# Patient Record
Sex: Female | Born: 1970
Health system: Southern US, Community
[De-identification: ages and names within clinical notes are randomized; demographics above are authoritative.]

## PROBLEM LIST (undated history)

## (undated) DIAGNOSIS — R202 Paresthesia of skin: Secondary | ICD-10-CM

## (undated) DIAGNOSIS — I10 Essential (primary) hypertension: Secondary | ICD-10-CM

## (undated) DIAGNOSIS — J45909 Unspecified asthma, uncomplicated: Secondary | ICD-10-CM

## (undated) DIAGNOSIS — M79603 Pain in arm, unspecified: Secondary | ICD-10-CM

## (undated) DIAGNOSIS — O139 Gestational [pregnancy-induced] hypertension without significant proteinuria, unspecified trimester: Secondary | ICD-10-CM

## (undated) HISTORY — DX: Pain in arm, unspecified: M79.603

## (undated) HISTORY — PX: TUBAL LIGATION: SHX77

## (undated) HISTORY — DX: Gestational (pregnancy-induced) hypertension without significant proteinuria, unspecified trimester: O13.9

## (undated) HISTORY — DX: Paresthesia of skin: R20.2

## (undated) HISTORY — DX: Essential (primary) hypertension: I10

## (undated) HISTORY — DX: Unspecified asthma, uncomplicated: J45.909

---

## 2009-01-03 ENCOUNTER — Encounter: Payer: Self-pay | Admitting: Cardiology

## 2009-01-05 ENCOUNTER — Encounter: Payer: Self-pay | Admitting: Cardiology

## 2009-01-13 ENCOUNTER — Encounter: Payer: Self-pay | Admitting: Cardiology

## 2009-01-15 ENCOUNTER — Encounter: Payer: Self-pay | Admitting: Cardiology

## 2009-01-15 ENCOUNTER — Ambulatory Visit: Payer: Self-pay | Admitting: Cardiology

## 2009-01-15 DIAGNOSIS — R0602 Shortness of breath: Secondary | ICD-10-CM | POA: Insufficient documentation

## 2009-01-15 DIAGNOSIS — R079 Chest pain, unspecified: Secondary | ICD-10-CM | POA: Insufficient documentation

## 2009-01-27 ENCOUNTER — Ambulatory Visit: Payer: Self-pay

## 2009-01-27 ENCOUNTER — Encounter: Payer: Self-pay | Admitting: Cardiology

## 2011-01-03 ENCOUNTER — Encounter: Payer: Self-pay | Admitting: Cardiology

## 2018-03-26 DIAGNOSIS — J4551 Severe persistent asthma with (acute) exacerbation: Secondary | ICD-10-CM | POA: Diagnosis not present

## 2018-08-05 DIAGNOSIS — M7552 Bursitis of left shoulder: Secondary | ICD-10-CM | POA: Diagnosis not present

## 2018-09-03 DIAGNOSIS — M7552 Bursitis of left shoulder: Secondary | ICD-10-CM | POA: Diagnosis not present

## 2018-09-03 DIAGNOSIS — R5383 Other fatigue: Secondary | ICD-10-CM | POA: Diagnosis not present

## 2018-09-13 DIAGNOSIS — M7552 Bursitis of left shoulder: Secondary | ICD-10-CM | POA: Diagnosis not present

## 2018-09-13 DIAGNOSIS — S46211A Strain of muscle, fascia and tendon of other parts of biceps, right arm, initial encounter: Secondary | ICD-10-CM | POA: Diagnosis not present

## 2018-09-19 DIAGNOSIS — M25612 Stiffness of left shoulder, not elsewhere classified: Secondary | ICD-10-CM | POA: Diagnosis not present

## 2018-09-19 DIAGNOSIS — M25512 Pain in left shoulder: Secondary | ICD-10-CM | POA: Diagnosis not present

## 2018-12-10 DIAGNOSIS — H9201 Otalgia, right ear: Secondary | ICD-10-CM | POA: Diagnosis not present

## 2019-01-14 DIAGNOSIS — M5431 Sciatica, right side: Secondary | ICD-10-CM | POA: Diagnosis not present

## 2019-01-21 DIAGNOSIS — M79661 Pain in right lower leg: Secondary | ICD-10-CM | POA: Diagnosis not present

## 2019-01-21 DIAGNOSIS — I8011 Phlebitis and thrombophlebitis of right femoral vein: Secondary | ICD-10-CM | POA: Diagnosis not present

## 2019-01-21 DIAGNOSIS — M5431 Sciatica, right side: Secondary | ICD-10-CM | POA: Diagnosis not present

## 2019-03-27 DIAGNOSIS — Z1159 Encounter for screening for other viral diseases: Secondary | ICD-10-CM | POA: Diagnosis not present

## 2019-03-27 DIAGNOSIS — J4551 Severe persistent asthma with (acute) exacerbation: Secondary | ICD-10-CM | POA: Diagnosis not present

## 2019-03-27 DIAGNOSIS — Z6823 Body mass index (BMI) 23.0-23.9, adult: Secondary | ICD-10-CM | POA: Diagnosis not present

## 2019-04-04 DIAGNOSIS — J4551 Severe persistent asthma with (acute) exacerbation: Secondary | ICD-10-CM | POA: Diagnosis not present

## 2019-04-04 DIAGNOSIS — Z1159 Encounter for screening for other viral diseases: Secondary | ICD-10-CM | POA: Diagnosis not present

## 2019-04-07 DIAGNOSIS — J45909 Unspecified asthma, uncomplicated: Secondary | ICD-10-CM | POA: Diagnosis not present

## 2019-04-07 DIAGNOSIS — J4520 Mild intermittent asthma, uncomplicated: Secondary | ICD-10-CM | POA: Diagnosis not present

## 2019-04-07 DIAGNOSIS — R0602 Shortness of breath: Secondary | ICD-10-CM | POA: Diagnosis not present

## 2019-04-07 DIAGNOSIS — J4551 Severe persistent asthma with (acute) exacerbation: Secondary | ICD-10-CM | POA: Diagnosis not present

## 2019-04-07 DIAGNOSIS — J45901 Unspecified asthma with (acute) exacerbation: Secondary | ICD-10-CM | POA: Diagnosis not present

## 2019-06-09 DIAGNOSIS — I10 Essential (primary) hypertension: Secondary | ICD-10-CM | POA: Diagnosis not present

## 2019-06-09 DIAGNOSIS — Z1159 Encounter for screening for other viral diseases: Secondary | ICD-10-CM | POA: Diagnosis not present

## 2019-06-18 DIAGNOSIS — I10 Essential (primary) hypertension: Secondary | ICD-10-CM | POA: Diagnosis not present

## 2019-07-16 DIAGNOSIS — Z1159 Encounter for screening for other viral diseases: Secondary | ICD-10-CM | POA: Diagnosis not present

## 2019-07-16 DIAGNOSIS — Z20828 Contact with and (suspected) exposure to other viral communicable diseases: Secondary | ICD-10-CM | POA: Diagnosis not present

## 2019-07-16 DIAGNOSIS — J45902 Unspecified asthma with status asthmaticus: Secondary | ICD-10-CM | POA: Diagnosis not present

## 2019-07-23 DIAGNOSIS — Z03818 Encounter for observation for suspected exposure to other biological agents ruled out: Secondary | ICD-10-CM | POA: Diagnosis not present

## 2019-09-30 ENCOUNTER — Other Ambulatory Visit: Payer: Self-pay

## 2019-09-30 ENCOUNTER — Encounter: Payer: Self-pay | Admitting: Legal Medicine

## 2019-09-30 ENCOUNTER — Ambulatory Visit: Payer: BC Managed Care – PPO | Admitting: Legal Medicine

## 2019-09-30 DIAGNOSIS — G540 Brachial plexus disorders: Secondary | ICD-10-CM | POA: Diagnosis not present

## 2019-09-30 DIAGNOSIS — R030 Elevated blood-pressure reading, without diagnosis of hypertension: Secondary | ICD-10-CM | POA: Diagnosis not present

## 2019-09-30 MED ORDER — PREDNISONE 50 MG PO TABS: ORAL_TABLET | ORAL | 0 refills | Status: DC

## 2019-09-30 MED ORDER — HYDROCODONE-ACETAMINOPHEN 5-325 MG PO TABS: 1.0000 | ORAL_TABLET | Freq: Four times a day (QID) | ORAL | Status: DC | PRN

## 2019-09-30 NOTE — Assessment & Plan Note (Addendum)
Patient needs steroids and medicine for neuritiis.  Try prednisone 50mg  qd for 5 days.and hydrocodone for pain.  May need neurontin. We discussed possible NCS/EMG.  No trauma of underlying diseases.  May need Chest x-ray.

## 2019-09-30 NOTE — Progress Notes (Signed)
Acute Office Visit  Subjective:    Patient ID: Alison Simpson, female    DOB: March 01, 1971, 49 y.o.   MRN: [id number]  Chief Complaint  Patient presents with  . Arm Pain    bilateral arm pain with numbness and tingling left arm    HPI Patient is in today for right arm pain.  Started last night until midnight. She has pain in brachial plexus bilaterally, no pain in neck.  Negative Lhermitte.  Normal pulses and sensation.  Past Medical History:  Diagnosis Date  . Pregnancy induced hypertension     Past Surgical History:  Procedure Laterality Date  . TUBAL LIGATION     1999    Family History  Problem Relation Age of Onset  . Diabetes Neg Hx   . Hypertension Neg Hx   . Coronary artery disease Neg Hx     Social History   Socioeconomic History  . Marital status: Married    Spouse name: Not on file  . Number of children: Not on file  . Years of education: Not on file  . Highest education level: Not on file  Occupational History  . Not on file  Tobacco Use  . Smoking status: Never Smoker  . Smokeless tobacco: Never Used  Substance and Sexual Activity  . Alcohol use: No  . Drug use: No  . Sexual activity: Yes  Other Topics Concern  . Not on file  Social History Narrative   Married, 1 child. Employed full time. Walks 20 min twice/week.    Social Determinants of Health   Financial Resource Strain:   . Difficulty of Paying Living Expenses:   Food Insecurity:   . Worried About Programme researcher, broadcasting/film/video in the Last Year:   . Barista in the Last Year:   Transportation Needs:   . Freight forwarder (Medical):   Marland Kitchen Lack of Transportation (Non-Medical):   Physical Activity:   . Days of Exercise per Week:   . Minutes of Exercise per Session:   Stress:   . Feeling of Stress :   Social Connections:   . Frequency of Communication with Friends and Family:   . Frequency of Social Gatherings with Friends and Family:   . Attends Religious Services:   . Active  Member of Clubs or Organizations:   . Attends Banker Meetings:   Marland Kitchen Marital Status:   Intimate Partner Violence:   . Fear of Current or Ex-Partner:   . Emotionally Abused:   Marland Kitchen Physically Abused:   . Sexually Abused:     Outpatient Medications Prior to Visit  Medication Sig Dispense Refill  . montelukast (SINGULAIR) 10 MG tablet Take 10 mg by mouth at bedtime.    Marland Kitchen olmesartan (BENICAR) 40 MG tablet Take 40 mg by mouth daily.    Marland Kitchen albuterol (PROVENTIL) (2.5 MG/3ML) 0.083% nebulizer solution INHALE 3 MILLILITERS (2.5 MG) BY NEBULIZATION ROUTE 3 TIMES PER DAY     No facility-administered medications prior to visit.    No Known Allergies  Review of Systems  Constitutional: Negative.   HENT: Negative.   Eyes: Negative.   Respiratory: Negative.   Cardiovascular: Negative.   Gastrointestinal: Negative.   Endocrine: Negative.   Genitourinary: Negative.   Musculoskeletal: Negative.   Neurological: Positive for numbness.  Psychiatric/Behavioral: Negative.        Objective:    Physical Exam Vitals reviewed.  Constitutional:      Appearance: Normal appearance.  HENT:  Head: Normocephalic and atraumatic.  Cardiovascular:     Rate and Rhythm: Normal rate and regular rhythm.     Pulses: Normal pulses.     Heart sounds: Normal heart sounds.  Pulmonary:     Effort: Pulmonary effort is normal.     Breath sounds: Normal breath sounds.  Musculoskeletal:        General: Normal range of motion.  Skin:    General: Skin is warm and dry.  Neurological:     General: No focal deficit present.     Mental Status: She is alert and oriented to person, place, and time.     Cranial Nerves: Cranial nerves are intact.     Motor: Motor function is intact.     Coordination: Coordination is intact.     Gait: Gait is intact.     Comments: Pain on pressure to brachial plexus bilaterally, no pain ROM neck or shoulders.  Negative Lhermitte.  No focal neurologic signs.  Both hands  warm with good pulses.     BP 110/70   Pulse 88   Temp 97.7 F (36.5 C)   Ht 5\' 2"  (1.575 m)   Wt 149 lb 3.2 oz (67.7 kg)   BMI 27.29 kg/m  Wt Readings from Last 3 Encounters:  09/30/19 149 lb 3.2 oz (67.7 kg)  01/15/09 149 lb (67.6 kg)    Health Maintenance Due  Topic Date Due  . HIV Screening  Never done  . TETANUS/TDAP  Never done  . PAP SMEAR-Modifier  Never done  . INFLUENZA VACCINE  Never done    There are no preventive care reminders to display for this patient.   No results found for: TSH Lab Results  Component Value Date   WBC 8.2 09/30/2019   HGB 13.8 09/30/2019   HCT 39.7 09/30/2019   MCV 83 09/30/2019   PLT 366 09/30/2019   Lab Results  Component Value Date   NA 143 09/30/2019   K 4.0 09/30/2019   CO2 23 09/30/2019   GLUCOSE 95 09/30/2019   BUN 10 09/30/2019   CREATININE 0.77 09/30/2019   BILITOT 0.2 09/30/2019   ALKPHOS 67 09/30/2019   AST 19 09/30/2019   ALT 24 09/30/2019   PROT 7.2 09/30/2019   ALBUMIN 4.2 09/30/2019   CALCIUM 9.4 09/30/2019   No results found for: CHOL No results found for: HDL No results found for: LDLCALC No results found for: TRIG No results found for: CHOLHDL No results found for: HGBA1C     Assessment & Plan:   Problem List Items Addressed This Visit      Nervous and Auditory   Brachial plexitis    Patient needs steroids and medicine for neuritiis.  Try prednisone 50mg  qd for 5 days.and hydrocodone for pain.  May need neurontin. We discussed possible NCS/EMG.  No trauma of underlying diseases.  May need Chest x-ray.      Relevant Medications   predniSONE (DELTASONE) 50 MG tablet   HYDROcodone-acetaminophen (NORCO/VICODIN) 5-325 MG per tablet 1 tablet   Other Relevant Orders   CBC with Differential (Completed)   Comprehensive metabolic panel (Completed)   C-reactive protein (Completed)     Return in about 1 week (around 10/07/2019).  Meds ordered this encounter  Medications  . predniSONE (DELTASONE)  50 MG tablet    Sig: One pill a day for 5 days    Dispense:  5 tablet    Refill:  0  . HYDROcodone-acetaminophen (NORCO/VICODIN) 5-325 MG per tablet 1  tablet     Brent Bulla, MD

## 2019-10-01 LAB — CBC WITH DIFFERENTIAL/PLATELET
Basophils Absolute: 0.1 10*3/uL (ref 0.0–0.2)
Basos: 1 %
EOS (ABSOLUTE): 0.1 10*3/uL (ref 0.0–0.4)
Eos: 2 %
Hematocrit: 39.7 % (ref 34.0–46.6)
Hemoglobin: 13.8 g/dL (ref 11.1–15.9)
Immature Grans (Abs): 0 10*3/uL (ref 0.0–0.1)
Immature Granulocytes: 0 %
Lymphocytes Absolute: 2.5 10*3/uL (ref 0.7–3.1)
Lymphs: 30 %
MCH: 28.8 pg (ref 26.6–33.0)
MCHC: 34.8 g/dL (ref 31.5–35.7)
MCV: 83 fL (ref 79–97)
Monocytes Absolute: 1.1 10*3/uL — ABNORMAL HIGH (ref 0.1–0.9)
Monocytes: 14 %
Neutrophils Absolute: 4.3 10*3/uL (ref 1.4–7.0)
Neutrophils: 53 %
Platelets: 366 10*3/uL (ref 150–450)
RBC: 4.79 x10E6/uL (ref 3.77–5.28)
RDW: 12.9 % (ref 11.7–15.4)
WBC: 8.2 10*3/uL (ref 3.4–10.8)

## 2019-10-01 LAB — COMPREHENSIVE METABOLIC PANEL
ALT: 24 IU/L (ref 0–32)
AST: 19 IU/L (ref 0–40)
Albumin/Globulin Ratio: 1.4 (ref 1.2–2.2)
Albumin: 4.2 g/dL (ref 3.8–4.8)
Alkaline Phosphatase: 67 IU/L (ref 39–117)
BUN/Creatinine Ratio: 13 (ref 9–23)
BUN: 10 mg/dL (ref 6–24)
Bilirubin Total: 0.2 mg/dL (ref 0.0–1.2)
CO2: 23 mmol/L (ref 20–29)
Calcium: 9.4 mg/dL (ref 8.7–10.2)
Chloride: 107 mmol/L — ABNORMAL HIGH (ref 96–106)
Creatinine, Ser: 0.77 mg/dL (ref 0.57–1.00)
GFR calc Af Amer: 106 mL/min/{1.73_m2} (ref >59)
GFR calc non Af Amer: 92 mL/min/{1.73_m2} (ref >59)
Globulin, Total: 3 g/dL (ref 1.5–4.5)
Glucose: 95 mg/dL (ref 65–99)
Potassium: 4 mmol/L (ref 3.5–5.2)
Sodium: 143 mmol/L (ref 134–144)
Total Protein: 7.2 g/dL (ref 6.0–8.5)

## 2019-10-01 LAB — C-REACTIVE PROTEIN: CRP: 3 mg/L (ref 0–10)

## 2019-10-01 NOTE — Progress Notes (Signed)
CBC normal, Kidney and liver tests normal, CRP 3 normal lp

## 2019-10-07 ENCOUNTER — Encounter: Payer: Self-pay | Admitting: Legal Medicine

## 2019-10-07 ENCOUNTER — Other Ambulatory Visit: Payer: Self-pay

## 2019-10-07 ENCOUNTER — Ambulatory Visit: Payer: BC Managed Care – PPO | Admitting: Legal Medicine

## 2019-10-07 DIAGNOSIS — G540 Brachial plexus disorders: Secondary | ICD-10-CM

## 2019-10-07 NOTE — Progress Notes (Signed)
Established Patient Office Visit  Subjective:  Patient ID: Alison Simpson, female    DOB: July 05, 1970  Age: 49 y.o. MRN: [id number]  CC:  Chief Complaint  Patient presents with  . Brachial Plexitis    Patient is here for follow up and she stated tha she feels better    HPI Alison Simpson presents for bilateral brachial plexitis.She is more than 50% improved.  Still some burning at times.  Strength has improved.  Past Medical History:  Diagnosis Date  . Pregnancy induced hypertension     Past Surgical History:  Procedure Laterality Date  . TUBAL LIGATION     1999    Family History  Problem Relation Age of Onset  . Diabetes Neg Hx   . Hypertension Neg Hx   . Coronary artery disease Neg Hx     Social History   Socioeconomic History  . Marital status: Married    Spouse name: Not on file  . Number of children: Not on file  . Years of education: Not on file  . Highest education level: Not on file  Occupational History  . Not on file  Tobacco Use  . Smoking status: Never Smoker  . Smokeless tobacco: Never Used  Substance and Sexual Activity  . Alcohol use: No  . Drug use: No  . Sexual activity: Yes    Partners: Male  Other Topics Concern  . Not on file  Social History Narrative   Married, 1 child. Employed full time. Walks 20 min twice/week.    Social Determinants of Health   Financial Resource Strain:   . Difficulty of Paying Living Expenses:   Food Insecurity:   . Worried About Programme researcher, broadcasting/film/video in the Last Year:   . Barista in the Last Year:   Transportation Needs:   . Freight forwarder (Medical):   Marland Kitchen Lack of Transportation (Non-Medical):   Physical Activity:   . Days of Exercise per Week:   . Minutes of Exercise per Session:   Stress:   . Feeling of Stress :   Social Connections:   . Frequency of Communication with Friends and Family:   . Frequency of Social Gatherings with Friends and Family:   . Attends Religious Services:   .  Active Member of Clubs or Organizations:   . Attends Banker Meetings:   Marland Kitchen Marital Status:   Intimate Partner Violence:   . Fear of Current or Ex-Partner:   . Emotionally Abused:   Marland Kitchen Physically Abused:   . Sexually Abused:     Outpatient Medications Prior to Visit  Medication Sig Dispense Refill  . albuterol (PROVENTIL) (2.5 MG/3ML) 0.083% nebulizer solution INHALE 3 MILLILITERS (2.5 MG) BY NEBULIZATION ROUTE 3 TIMES PER DAY    . montelukast (SINGULAIR) 10 MG tablet Take 10 mg by mouth at bedtime.    Marland Kitchen olmesartan (BENICAR) 40 MG tablet Take 40 mg by mouth daily.    . predniSONE (DELTASONE) 50 MG tablet One pill a day for 5 days 5 tablet 0  . HYDROcodone-acetaminophen (NORCO/VICODIN) 5-325 MG per tablet 1 tablet      No facility-administered medications prior to visit.    No Known Allergies  ROS Review of Systems  Constitutional: Negative.   HENT: Negative.   Eyes: Negative.   Respiratory: Negative.   Cardiovascular: Negative.   Gastrointestinal: Negative.   Endocrine: Negative.   Genitourinary: Negative.   Musculoskeletal: Negative.   Skin: Negative.   Neurological:  Negative.   Psychiatric/Behavioral: Negative.       Objective:    Physical Exam  Constitutional: She appears well-developed and well-nourished. She appears lethargic.  HENT:  Head: Normocephalic and atraumatic.  Eyes: Pupils are equal, round, and reactive to light.  Cardiovascular: Normal rate, regular rhythm and normal heart sounds.  Pulmonary/Chest: Effort normal and breath sounds normal.  Musculoskeletal:     Cervical back: Normal range of motion.  Neurological: She has normal strength. She appears lethargic. No cranial nerve deficit or sensory deficit. She displays a negative Romberg sign.  No pain axilla, sensation and strength in UE all normal  Vitals reviewed.   BP 110/70 (BP Location: Right Arm, Patient Position: Sitting)   Pulse 74   Temp (!) 97.5 F (36.4 C) (Temporal)    Resp 16   Ht 5\' 2"  (1.575 m)   Wt 151 lb (68.5 kg)   BMI 27.62 kg/m  Wt Readings from Last 3 Encounters:  10/07/19 151 lb (68.5 kg)  09/30/19 149 lb 3.2 oz (67.7 kg)  01/15/09 149 lb (67.6 kg)     Health Maintenance Due  Topic Date Due  . HIV Screening  Never done  . TETANUS/TDAP  Never done  . PAP SMEAR-Modifier  Never done    There are no preventive care reminders to display for this patient.  No results found for: TSH Lab Results  Component Value Date   WBC 8.2 09/30/2019   HGB 13.8 09/30/2019   HCT 39.7 09/30/2019   MCV 83 09/30/2019   PLT 366 09/30/2019   Lab Results  Component Value Date   NA 143 09/30/2019   K 4.0 09/30/2019   CO2 23 09/30/2019   GLUCOSE 95 09/30/2019   BUN 10 09/30/2019   CREATININE 0.77 09/30/2019   BILITOT 0.2 09/30/2019   ALKPHOS 67 09/30/2019   AST 19 09/30/2019   ALT 24 09/30/2019   PROT 7.2 09/30/2019   ALBUMIN 4.2 09/30/2019   CALCIUM 9.4 09/30/2019   No results found for: CHOL No results found for: HDL No results found for: LDLCALC No results found for: TRIG No results found for: CHOLHDL No results found for: HGBA1C    Assessment & Plan:   Problem List Items Addressed This Visit      Nervous and Auditory   Brachial plexitis    She is much improved and back to work.  No weakness.  Occasion burning but seldom.         No orders of the defined types were placed in this encounter.   Follow-up: Return if symptoms worsen or fail to improve.    Reinaldo Meeker, MD

## 2019-10-07 NOTE — Assessment & Plan Note (Signed)
She is much improved and back to work.  No weakness.  Occasion burning but seldom.

## 2019-10-16 ENCOUNTER — Encounter: Payer: Self-pay | Admitting: Legal Medicine

## 2019-10-16 ENCOUNTER — Ambulatory Visit: Payer: BC Managed Care – PPO | Admitting: Legal Medicine

## 2019-10-16 ENCOUNTER — Other Ambulatory Visit: Payer: Self-pay

## 2019-10-16 DIAGNOSIS — R1011 Right upper quadrant pain: Secondary | ICD-10-CM | POA: Diagnosis not present

## 2019-10-16 MED ORDER — ONDANSETRON HCL 4 MG PO TABS: 4.0000 mg | ORAL_TABLET | Freq: Three times a day (TID) | ORAL | 0 refills | Status: DC | PRN

## 2019-10-16 MED ORDER — KETOROLAC TROMETHAMINE 60 MG/2ML IM SOLN
60.0000 mg | Freq: Once | INTRAMUSCULAR | Status: AC
  Administered 2019-10-16: 15:00:00 60 mg via INTRAMUSCULAR

## 2019-10-16 MED ORDER — PROMETHAZINE HCL 25 MG/ML IJ SOLN
25.0000 mg | Freq: Once | INTRAMUSCULAR | Status: AC
  Administered 2019-10-16: 25 mg via INTRAMUSCULAR

## 2019-10-16 MED ORDER — HYDROCODONE-ACETAMINOPHEN 10-325 MG PO TABS: 1.0000 | ORAL_TABLET | Freq: Three times a day (TID) | ORAL | 0 refills | Status: DC | PRN

## 2019-10-16 NOTE — Assessment & Plan Note (Signed)
Sudden onset of RUQ abdominal pain.  Severe nausea.  Strongly suspect cholecystitis.  Need ultrasound

## 2019-10-16 NOTE — Progress Notes (Signed)
Established Patient Office Visit  Subjective:  Patient ID: Alison Simpson, female    DOB: Dec 08, 1970  Age: 49 y.o. MRN: [id number]  CC:  Chief Complaint  Patient presents with  . Abdominal Pain    HPI Alison Simpson presents for RUQ pain sudden onset last night. She ate out and then had sudden attack of RUQ pain with nausea and burping. She is in severe pain with continuous nausea.    Past Medical History:  Diagnosis Date  . Pregnancy induced hypertension     Past Surgical History:  Procedure Laterality Date  . TUBAL LIGATION     1999    Family History  Problem Relation Age of Onset  . Diabetes Neg Hx   . Hypertension Neg Hx   . Coronary artery disease Neg Hx     Social History   Socioeconomic History  . Marital status: Married    Spouse name: Not on file  . Number of children: Not on file  . Years of education: Not on file  . Highest education level: Not on file  Occupational History    Employer: [employer]  Tobacco Use  . Smoking status: Never Smoker  . Smokeless tobacco: Never Used  Substance and Sexual Activity  . Alcohol use: No  . Drug use: No  . Sexual activity: Yes    Partners: Male  Other Topics Concern  . Not on file  Social History Narrative   Married, 1 child. Employed full time. Walks 20 min twice/week.    Social Determinants of Health   Financial Resource Strain:   . Difficulty of Paying Living Expenses:   Food Insecurity:   . Worried About Charity fundraiser in the Last Year:   . Arboriculturist in the Last Year:   Transportation Needs:   . Film/video editor (Medical):   Marland Kitchen Lack of Transportation (Non-Medical):   Physical Activity:   . Days of Exercise per Week:   . Minutes of Exercise per Session:   Stress:   . Feeling of Stress :   Social Connections:   . Frequency of Communication with Friends and Family:   . Frequency of Social Gatherings with Friends and Family:   . Attends Religious Services:   . Active Member of  Clubs or Organizations:   . Attends Archivist Meetings:   Marland Kitchen Marital Status:   Intimate Partner Violence:   . Fear of Current or Ex-Partner:   . Emotionally Abused:   Marland Kitchen Physically Abused:   . Sexually Abused:     Outpatient Medications Prior to Visit  Medication Sig Dispense Refill  . albuterol (PROVENTIL) (2.5 MG/3ML) 0.083% nebulizer solution INHALE 3 MILLILITERS (2.5 MG) BY NEBULIZATION ROUTE 3 TIMES PER DAY    . montelukast (SINGULAIR) 10 MG tablet Take 10 mg by mouth at bedtime.    Marland Kitchen olmesartan (BENICAR) 40 MG tablet Take 40 mg by mouth daily.     No facility-administered medications prior to visit.    No Known Allergies  ROS Review of Systems  Constitutional: Negative.   HENT: Negative.   Eyes: Negative.   Respiratory: Negative.   Cardiovascular: Negative.   Gastrointestinal: Positive for abdominal pain.  Endocrine: Negative.   Genitourinary: Negative.   Musculoskeletal: Negative.   Skin: Negative.   Neurological: Negative.   Psychiatric/Behavioral: Negative.       Objective:    Physical Exam  Cardiovascular: Normal rate, regular rhythm, normal heart sounds and intact distal pulses.  Pulmonary/Chest: Effort normal.  Abdominal: There is abdominal tenderness. There is guarding.  Positive murphy sign  Vitals reviewed.   BP 128/80   Pulse 85   Temp (!) 97.2 F (36.2 C)   Resp 18   Ht 5\' 2"  (1.575 m)   Wt 148 lb 12.8 oz (67.5 kg)   SpO2 96%   BMI 27.22 kg/m  Wt Readings from Last 3 Encounters:  10/16/19 148 lb 12.8 oz (67.5 kg)  10/07/19 151 lb (68.5 kg)  09/30/19 149 lb 3.2 oz (67.7 kg)     Health Maintenance Due  Topic Date Due  . HIV Screening  Never done  . TETANUS/TDAP  Never done  . PAP SMEAR-Modifier  Never done    There are no preventive care reminders to display for this patient.  No results found for: TSH Lab Results  Component Value Date   WBC 8.2 09/30/2019   HGB 13.8 09/30/2019   HCT 39.7 09/30/2019   MCV 83  09/30/2019   PLT 366 09/30/2019   Lab Results  Component Value Date   NA 143 09/30/2019   K 4.0 09/30/2019   CO2 23 09/30/2019   GLUCOSE 95 09/30/2019   BUN 10 09/30/2019   CREATININE 0.77 09/30/2019   BILITOT 0.2 09/30/2019   ALKPHOS 67 09/30/2019   AST 19 09/30/2019   ALT 24 09/30/2019   PROT 7.2 09/30/2019   ALBUMIN 4.2 09/30/2019   CALCIUM 9.4 09/30/2019   No results found for: CHOL No results found for: HDL No results found for: LDLCALC No results found for: TRIG No results found for: CHOLHDL No results found for: 10/02/2019    Assessment & Plan:   Problem List Items Addressed This Visit      Other   RUQ abdominal pain    Sudden onset of RUQ abdominal pain.  Severe nausea.  Strongly suspect cholecystitis.  Need ultrasound      Relevant Medications   ketorolac (TORADOL) injection 60 mg   promethazine (PHENERGAN) injection 25 mg   HYDROcodone-acetaminophen (NORCO) 10-325 MG tablet   ondansetron (ZOFRAN) 4 MG tablet   Other Relevant Orders   [id number] Abdomen Limited   CBC with Differential   Comprehensive metabolic panel    we are unable to get stat ultraosund today, we set up in AM.  She was given toradol 60mg  and phenergan 25mg  IM.  Rx for zofran and hydrocodone sent in to get her through the night.  Meds ordered this encounter  Medications  . ketorolac (TORADOL) injection 60 mg  . promethazine (PHENERGAN) injection 25 mg  . HYDROcodone-acetaminophen (NORCO) 10-325 MG tablet    Sig: Take 1 tablet by mouth every 8 (eight) hours as needed for up to 5 days.    Dispense:  15 tablet    Refill:  0  . ondansetron (ZOFRAN) 4 MG tablet    Sig: Take 1 tablet (4 mg total) by mouth every 8 (eight) hours as needed for nausea or vomiting.    Dispense:  20 tablet    Refill:  0    Follow-up: Return in about 1 week (around 10/23/2019).    , MD

## 2019-10-17 DIAGNOSIS — R1011 Right upper quadrant pain: Secondary | ICD-10-CM | POA: Diagnosis not present

## 2019-10-17 LAB — CBC WITH DIFFERENTIAL/PLATELET
Basophils Absolute: 0.1 10*3/uL (ref 0.0–0.2)
Basos: 1 %
EOS (ABSOLUTE): 0.2 10*3/uL (ref 0.0–0.4)
Eos: 2 %
Hematocrit: 41.1 % (ref 34.0–46.6)
Hemoglobin: 14.6 g/dL (ref 11.1–15.9)
Immature Grans (Abs): 0 10*3/uL (ref 0.0–0.1)
Immature Granulocytes: 0 %
Lymphocytes Absolute: 3.2 10*3/uL — ABNORMAL HIGH (ref 0.7–3.1)
Lymphs: 31 %
MCH: 29.6 pg (ref 26.6–33.0)
MCHC: 35.5 g/dL (ref 31.5–35.7)
MCV: 83 fL (ref 79–97)
Monocytes Absolute: 1.1 10*3/uL — ABNORMAL HIGH (ref 0.1–0.9)
Monocytes: 11 %
Neutrophils Absolute: 5.5 10*3/uL (ref 1.4–7.0)
Neutrophils: 55 %
Platelets: 349 10*3/uL (ref 150–450)
RBC: 4.94 x10E6/uL (ref 3.77–5.28)
RDW: 13.3 % (ref 11.7–15.4)
WBC: 10 10*3/uL (ref 3.4–10.8)

## 2019-10-17 LAB — COMPREHENSIVE METABOLIC PANEL
ALT: 26 IU/L (ref 0–32)
AST: 18 IU/L (ref 0–40)
Albumin/Globulin Ratio: 1.6 (ref 1.2–2.2)
Albumin: 4.5 g/dL (ref 3.8–4.8)
Alkaline Phosphatase: 73 IU/L (ref 39–117)
BUN/Creatinine Ratio: 14 (ref 9–23)
BUN: 9 mg/dL (ref 6–24)
Bilirubin Total: 0.2 mg/dL (ref 0.0–1.2)
CO2: 23 mmol/L (ref 20–29)
Calcium: 9.4 mg/dL (ref 8.7–10.2)
Chloride: 103 mmol/L (ref 96–106)
Creatinine, Ser: 0.64 mg/dL (ref 0.57–1.00)
GFR calc Af Amer: 123 mL/min/{1.73_m2} (ref >59)
GFR calc non Af Amer: 107 mL/min/{1.73_m2} (ref >59)
Globulin, Total: 2.9 g/dL (ref 1.5–4.5)
Glucose: 92 mg/dL (ref 65–99)
Potassium: 4.4 mmol/L (ref 3.5–5.2)
Sodium: 142 mmol/L (ref 134–144)
Total Protein: 7.4 g/dL (ref 6.0–8.5)

## 2019-10-17 NOTE — Progress Notes (Signed)
CBC normal, Kidney and liver tests normal

## 2019-10-20 DIAGNOSIS — Z20828 Contact with and (suspected) exposure to other viral communicable diseases: Secondary | ICD-10-CM | POA: Diagnosis not present

## 2019-10-20 DIAGNOSIS — R509 Fever, unspecified: Secondary | ICD-10-CM | POA: Diagnosis not present

## 2019-10-21 ENCOUNTER — Encounter: Payer: Self-pay | Admitting: Legal Medicine

## 2019-10-21 ENCOUNTER — Ambulatory Visit: Payer: BC Managed Care – PPO | Admitting: Legal Medicine

## 2019-10-21 ENCOUNTER — Other Ambulatory Visit: Payer: Self-pay

## 2019-10-21 ENCOUNTER — Encounter: Payer: Self-pay | Admitting: Neurology

## 2019-10-21 VITALS — BP 150/90 | HR 77 | Temp 98.5°F | Resp 17 | Ht 61.0 in | Wt 147.4 lb

## 2019-10-21 DIAGNOSIS — R1084 Generalized abdominal pain: Secondary | ICD-10-CM

## 2019-10-21 DIAGNOSIS — J45909 Unspecified asthma, uncomplicated: Secondary | ICD-10-CM | POA: Diagnosis not present

## 2019-10-21 DIAGNOSIS — R1011 Right upper quadrant pain: Secondary | ICD-10-CM

## 2019-10-21 DIAGNOSIS — G540 Brachial plexus disorders: Secondary | ICD-10-CM

## 2019-10-21 LAB — POCT URINALYSIS DIP (CLINITEK)
Bilirubin, UA: NEGATIVE
Blood, UA: NEGATIVE
Glucose, UA: NEGATIVE mg/dL
Ketones, POC UA: NEGATIVE mg/dL
Nitrite, UA: NEGATIVE
POC PROTEIN,UA: NEGATIVE
Spec Grav, UA: 1.02 (ref 1.010–1.025)
Urobilinogen, UA: 0.2 E.U./dL
pH, UA: 6 (ref 5.0–8.0)

## 2019-10-21 NOTE — Assessment & Plan Note (Signed)
Abdominal pain keeps changing and is now more in LLQ than epigastric.  She is still burping.  Check lipase and CT abdomen for pancreatic disease and possible recurrence or diverticulitis. Gall bladder ultrasound is negative.  Start dexalant 60mg  samples, may require Gi consult.

## 2019-10-21 NOTE — Assessment & Plan Note (Signed)
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  Get CXR for possible cervical rib, refer to neurology after NCS/EMG She has some concerns for MS

## 2019-10-21 NOTE — Progress Notes (Signed)
Established Patient Office Visit  Subjective:  Patient ID: Alison Simpson, female    DOB: [birth year]  Age: 49 y.o. MRN: [id number] [facility] reviewed CC:  Chief Complaint  Patient presents with  . Nausea    After eat some bite of food, she feels nausea and abdominal pain  . Numbness    Left arm numbness since 3 weeks ago    HPI Alison Simpson presents for abdominal pain.  Brachial plexitis: less pain in axilla but still paresthesias left arm but normal sensation.  She was seen in ER but no chest x-ray.  She will need NCS/EMG.  Abdominal pain.  She has negative gall bladder ultrasound.  She still has pain on eating radiating to back.  She gets loose B< and cramping.  She has history of diverticulitis. Using zofran. Negative COVID test. CBC and CMP all normal.  Past Medical History:  Diagnosis Date  . Pregnancy induced hypertension     Past Surgical History:  Procedure Laterality Date  . TUBAL LIGATION     1999    Family History  Problem Relation Age of Onset  . Diabetes Neg Hx   . Hypertension Neg Hx   . Coronary artery disease Neg Hx     Social History   Socioeconomic History  . Marital status: Married    Spouse name: Not on file  . Number of children: Not on file  . Years of education: Not on file  . Highest education level: Not on file  Occupational History    Employer: [employer]  Tobacco Use  . Smoking status: Never Smoker  . Smokeless tobacco: Never Used  Substance and Sexual Activity  . Alcohol use: No  . Drug use: No  . Sexual activity: Yes    Partners: Male  Other Topics Concern  . Not on file  Social History Narrative   Married, 1 child. Employed full time. Walks 20 min twice/week.    Social Determinants of Health   Financial Resource Strain:   . Difficulty of Paying Living Expenses:   Food Insecurity:   . Worried About Programme researcher, broadcasting/film/video in the Last Year:   . Barista in the Last Year:   Transportation Needs:   . Sales promotion account executive (Medical):   Marland Kitchen Lack of Transportation (Non-Medical):   Physical Activity:   . Days of Exercise per Week:   . Minutes of Exercise per Session:   Stress:   . Feeling of Stress :   Social Connections:   . Frequency of Communication with Friends and Family:   . Frequency of Social Gatherings with Friends and Family:   . Attends Religious Services:   . Active Member of Clubs or Organizations:   . Attends Banker Meetings:   Marland Kitchen Marital Status:   Intimate Partner Violence:   . Fear of Current or Ex-Partner:   . Emotionally Abused:   Marland Kitchen Physically Abused:   . Sexually Abused:     Outpatient Medications Prior to Visit  Medication Sig Dispense Refill  . albuterol (PROVENTIL) (2.5 MG/3ML) 0.083% nebulizer solution INHALE 3 MILLILITERS (2.5 MG) BY NEBULIZATION ROUTE 3 TIMES PER DAY    . montelukast (SINGULAIR) 10 MG tablet Take 10 mg by mouth at bedtime.    Marland Kitchen olmesartan (BENICAR) 40 MG tablet Take 40 mg by mouth daily.    . ondansetron (ZOFRAN) 4 MG tablet Take 1 tablet (4 mg total) by mouth every 8 (eight) hours as  needed for nausea or vomiting. 20 tablet 0  . HYDROcodone-acetaminophen (NORCO) 10-325 MG tablet Take 1 tablet by mouth every 8 (eight) hours as needed for up to 5 days. 15 tablet 0   No facility-administered medications prior to visit.    No Known Allergies  ROS Review of Systems  Constitutional: Negative.   HENT: Negative.   Eyes: Negative.   Respiratory: Negative.   Cardiovascular: Negative.   Gastrointestinal: Positive for abdominal pain.  Genitourinary: Negative.   Musculoskeletal: Negative.   Neurological: Negative.        Paresthesias left arm  Psychiatric/Behavioral: Negative.       Objective:    Physical Exam  Constitutional: She is oriented to person, place, and time. She appears well-developed and well-nourished.  HENT:  Head: Normocephalic and atraumatic.  Right Ear: External ear normal.  Left Ear: External ear normal.    Nose: Nose normal.  Mouth/Throat: Oropharynx is clear and moist.  Eyes: Pupils are equal, round, and reactive to light. Conjunctivae and EOM are normal.  Abdominal: Bowel sounds are normal.  Tender ness llq but some pain diffusely.  Musculoskeletal:        General: Normal range of motion.     Cervical back: Normal range of motion and neck supple.  Neurological: She is alert and oriented to person, place, and time. She has normal reflexes.  Skin: Skin is warm and dry.  Psychiatric: She has a normal mood and affect. Her behavior is normal.  Vitals reviewed.   BP (!) 150/90 (BP Location: Right Arm, Patient Position: Sitting)   Pulse 77   Temp 98.5 F (36.9 C) (Temporal)   Resp 17   Ht 5\' 1"  (1.549 m)   Wt 147 lb 6.4 oz (66.9 kg)   BMI 27.85 kg/m  Wt Readings from Last 3 Encounters:  10/21/19 147 lb 6.4 oz (66.9 kg)  10/16/19 148 lb 12.8 oz (67.5 kg)  10/07/19 151 lb (68.5 kg)     Health Maintenance Due  Topic Date Due  . HIV Screening  Never done  . TETANUS/TDAP  Never done  . PAP SMEAR-Modifier  Never done    There are no preventive care reminders to display for this patient.  No results found for: TSH Lab Results  Component Value Date   WBC 10.0 10/16/2019   HGB 14.6 10/16/2019   HCT 41.1 10/16/2019   MCV 83 10/16/2019   PLT 349 10/16/2019   Lab Results  Component Value Date   NA 142 10/16/2019   K 4.4 10/16/2019   CO2 23 10/16/2019   GLUCOSE 92 10/16/2019   BUN 9 10/16/2019   CREATININE 0.64 10/16/2019   BILITOT 0.2 10/16/2019   ALKPHOS 73 10/16/2019   AST 18 10/16/2019   ALT 26 10/16/2019   PROT 7.4 10/16/2019   ALBUMIN 4.5 10/16/2019   CALCIUM 9.4 10/16/2019   No results found for: CHOL No results found for: HDL No results found for: LDLCALC No results found for: TRIG No results found for: CHOLHDL No results found for: HGBA1C    Assessment & Plan:   Problem List Items Addressed This Visit      Nervous and Auditory   Brachial plexitis     The neuropathy from axillary inflamation has improved on right, the left arm still has "cold feelings" but normal motor and light touch sensation.  Get CXR for possible cervical rib, refer to neurology after NCS/EMG She has some concerns for MS      Relevant Orders  DG Chest 2 View   Ambulatory referral to Neurology     Other   RUQ abdominal pain    Abdominal pain keeps changing and is now more in LLQ than epigastric.  She is still burping.  Check lipase and CT abdomen for pancreatic disease and possible recurrence or diverticulitis. Gall bladder ultrasound is negative.  Start dexalant 60mg  samples, may require Gi consult.      Relevant Orders   CT Abdomen Pelvis W Contrast    Other Visit Diagnoses    Abdominal pain, diffuse    -  Primary   Relevant Orders   POCT URINALYSIS DIP (CLINITEK) (Completed)   Lipase   Urine Culture   C-reactive protein   CT Abdomen Pelvis W Contrast      No orders of the defined types were placed in this encounter.   Follow-up: Return in about 2 weeks (around 11/04/2019).    01/04/2020, MD

## 2019-10-21 NOTE — Patient Instructions (Signed)
Gastritis, Adult  Gastritis is swelling (inflammation) of the stomach. Gastritis can develop quickly (acute). It can also develop slowly over time (chronic). It is important to get help for this condition. If you do not get help, your stomach can bleed, and you can get sores (ulcers) in your stomach. What are the causes? This condition may be caused by:  Germs that get to your stomach.  Drinking too much alcohol.  Medicines you are taking.  Too much acid in the stomach.  A disease of the intestines or stomach.  Stress.  An allergic reaction.  Crohn's disease.  Some cancer treatments (radiation). Sometimes the cause of this condition is not known. What are the signs or symptoms? Symptoms of this condition include:  Pain in your stomach.  A burning feeling in your stomach.  Feeling sick to your stomach (nauseous).  Throwing up (vomiting).  Feeling too full after you eat.  Weight loss.  Bad breath.  Throwing up blood.  Blood in your poop (stool). How is this diagnosed? This condition may be diagnosed with:  Your medical history and symptoms.  A physical exam.  Tests. These can include: ? Blood tests. ? Stool tests. ? A procedure to look inside your stomach (upper endoscopy). ? A test in which a sample of tissue is taken for testing (biopsy). How is this treated? Treatment for this condition depends on what caused it. You may be given:  Antibiotic medicine, if your condition was caused by germs.  H2 blockers and similar medicines, if your condition was caused by too much acid. Follow these instructions at home: Medicines  Take over-the-counter and prescription medicines only as told by your doctor.  If you were prescribed an antibiotic medicine, take it as told by your doctor. Do not stop taking it even if you start to feel better. Eating and drinking   Eat small meals often, instead of large meals.  Avoid foods and drinks that make your symptoms  worse.  Drink enough fluid to keep your pee (urine) pale yellow. Alcohol use  Do not drink alcohol if: ? Your doctor tells you not to drink. ? You are pregnant, may be pregnant, or are planning to become pregnant.  If you drink alcohol: ? Limit your use to:  0-1 drink a day for women.  0-2 drinks a day for men. ? Be aware of how much alcohol is in your drink. In the [place] one drink equals one 12 oz bottle of beer (355 mL), one 5 oz glass of wine (148 mL), or one 1 oz glass of hard liquor (44 mL). General instructions  Talk with your doctor about ways to manage stress. You can exercise or do deep breathing, meditation, or yoga.  Do not smoke or use products that have nicotine or tobacco. If you need help quitting, ask your doctor.  Keep all follow-up visits as told by your doctor. This is important. Contact a doctor if:  Your symptoms get worse.  Your symptoms go away and then come back. Get help right away if:  You throw up blood or something that looks like coffee grounds.  You have black or dark red poop.  You throw up any time you try to drink fluids.  Your stomach pain gets worse.  You have a fever.  You do not feel better after one week. Summary  Gastritis is swelling (inflammation) of the stomach.  You must get help for this condition. If you do not get help, your stomach   can bleed, and you can get sores (ulcers).  This condition is diagnosed with medical history, physical exam, or tests.  You can be treated with medicines for germs or medicines to block too much acid in your stomach. This information is not intended to replace advice given to you by your health care provider. Make sure you discuss any questions you have with your health care provider. Document Revised: 11/06/2017 Document Reviewed: 11/06/2017 Elsevier Patient Education  2020 Elsevier Inc.  

## 2019-10-22 LAB — LIPASE: Lipase: 40 U/L (ref 14–72)

## 2019-10-22 LAB — C-REACTIVE PROTEIN: CRP: 25 mg/L — ABNORMAL HIGH (ref 0–10)

## 2019-10-22 NOTE — Progress Notes (Signed)
CRP high- there is inflammation, lipase normal lp

## 2019-10-23 LAB — URINE CULTURE

## 2019-10-23 NOTE — Progress Notes (Signed)
Negative urine culture lp

## 2019-10-28 ENCOUNTER — Other Ambulatory Visit: Payer: Self-pay

## 2019-10-28 ENCOUNTER — Encounter: Payer: Self-pay | Admitting: Neurology

## 2019-10-28 ENCOUNTER — Ambulatory Visit: Payer: BC Managed Care – PPO | Admitting: Neurology

## 2019-10-28 VITALS — BP 135/92 | HR 78 | Temp 98.4°F | Ht 61.0 in | Wt 151.0 lb

## 2019-10-28 DIAGNOSIS — M542 Cervicalgia: Secondary | ICD-10-CM | POA: Diagnosis not present

## 2019-10-28 DIAGNOSIS — R202 Paresthesia of skin: Secondary | ICD-10-CM | POA: Insufficient documentation

## 2019-10-28 MED ORDER — DULOXETINE HCL 60 MG PO CPEP
60.0000 mg | ORAL_CAPSULE | Freq: Every day | ORAL | 12 refills | Status: DC
Start: 2019-10-28 — End: 2020-12-07

## 2019-10-28 MED ORDER — OLMESARTAN MEDOXOMIL 40 MG PO TABS: 40.0000 mg | ORAL_TABLET | Freq: Every day | ORAL | 2 refills | Status: DC

## 2019-10-28 MED ORDER — MONTELUKAST SODIUM 10 MG PO TABS: 10.0000 mg | ORAL_TABLET | Freq: Every day | ORAL | 2 refills | Status: DC

## 2019-10-28 NOTE — Progress Notes (Signed)
PATIENT: Alison Simpson DOB: Feb 01, 1971  Chief Complaint  Patient presents with  . Possible Brachial Plexitis    Reports pain in right arm. Tingling in right shoulder, arm and fingers.   Marland Kitchen PCP    Abigail Miyamoto, MD     HISTORICAL  Alison Simpson is a 49 year old female, seen in request by her primary care physician Dr. Marina Goodell, [place] for evaluation of right upper extremity paresthesia, initial evaluation was on October 28, 2019.  I have reviewed and summarized the referring note from the referring physician.  She had a past medical history of hypertension, works as a Theatre manager, has close contact with COVID-19 patient, including her husband, but she was tested multiple times, all were negative  She received her first COVID-19 vaccination to right arm, March 10, there was no significant side effect then, on September 30, 2019, she woke up in the middle of the night with severe right arm pain from right shoulder down, deep achy, 10 out of 10, but she denied difficulty using her right arm, she actually presented to the emergency room, but waited long hours, went back home without being seen, the severe pain right arm last about 6 hours, next day, she noticed similar involvement to left arm but milder degree, she described it as freezing cold sensation, from left shoulder to all left fingers which has been persistent since its onset.  She was seen by her primary care physician next day, was treated with tapering dose of prednisone for 5 days, with mild improvement,  She received her second Pfizer COVID-19 vaccination on April 7 to right shoulder, a week later on October 15, 2019, she woke up with severe right below chest area pain, with nausea, ultrasound of abdomen was negative, her symptoms last about a week, she has no appetite, achy all over, low-grade fever to 100 last for 2 days, intermittent pain at different spots throughout her body, her symptoms improved with  Phenergan as needed, Toradol, and some GI medications,  Since then, she also noticed mild pressure headache on a daily basis,   October 26, 2019, she also noticed pinprick sensation when she urinated, short lasting, reported UA and urine culture was negative  Laboratory evaluation on October 21, 2019, normal C-reactive protein, urine analysis trace leukocyte, negative urine culture, lipase, CMP, CBC,  REVIEW OF SYSTEMS: Full 14 system review of systems performed and notable only for as above All other review of systems were negative.  ALLERGIES: No Known Allergies  HOME MEDICATIONS: Current Outpatient Medications  Medication Sig Dispense Refill  . albuterol (PROVENTIL) (2.5 MG/3ML) 0.083% nebulizer solution Take by nebulization as needed.     . montelukast (SINGULAIR) 10 MG tablet Take 10 mg by mouth at bedtime.    Marland Kitchen olmesartan (BENICAR) 40 MG tablet Take 40 mg by mouth daily.     No current facility-administered medications for this visit.    PAST MEDICAL HISTORY: Past Medical History:  Diagnosis Date  . Arm pain   . Asthma   . Hypertension   . Pregnancy induced hypertension   . Tingling     PAST SURGICAL HISTORY: Past Surgical History:  Procedure Laterality Date  . TUBAL LIGATION     1999    FAMILY HISTORY: Family History  Problem Relation Age of Onset  . Cataracts Mother   . Other Father        unsure of medical history  . Diabetes Neg Hx   . Hypertension  Neg Hx   . Coronary artery disease Neg Hx     SOCIAL HISTORY: Social History   Socioeconomic History  . Marital status: Married    Spouse name: Not on file  . Number of children: 1  . Years of education: some college  . Highest education level: Not on file  Occupational History  . Occupation: works w/ Print production planner day program     Employer: [employer]  Tobacco Use  . Smoking status: Never Smoker  . Smokeless tobacco: Never Used  Substance and Sexual Activity  . Alcohol use: No  . Drug use: No    . Sexual activity: Yes    Partners: Male  Other Topics Concern  . Not on file  Social History Narrative   Married, 1 biological child, 2 adopted children.   Employed full time. Walks 20 min twice/week.    Right-handed.   1-5 glasses of soft drinks per day.   Social Determinants of Health   Financial Resource Strain:   . Difficulty of Paying Living Expenses:   Food Insecurity:   . Worried About Programme researcher, broadcasting/film/video in the Last Year:   . Barista in the Last Year:   Transportation Needs:   . Freight forwarder (Medical):   Marland Kitchen Lack of Transportation (Non-Medical):   Physical Activity:   . Days of Exercise per Week:   . Minutes of Exercise per Session:   Stress:   . Feeling of Stress :   Social Connections:   . Frequency of Communication with Friends and Family:   . Frequency of Social Gatherings with Friends and Family:   . Attends Religious Services:   . Active Member of Clubs or Organizations:   . Attends Banker Meetings:   Marland Kitchen Marital Status:   Intimate Partner Violence:   . Fear of Current or Ex-Partner:   . Emotionally Abused:   Marland Kitchen Physically Abused:   . Sexually Abused:      PHYSICAL EXAM   Vitals:   10/28/19 0954  BP: (!) 135/92  Pulse: 78  Temp: 98.4 F (36.9 C)  Weight: 151 lb (68.5 kg)  Height: 5\' 1"  (1.549 m)    Not recorded      Body mass index is 28.53 kg/m.  PHYSICAL EXAMNIATION:  Gen: NAD, conversant, well nourised, well groomed                     Cardiovascular: Regular rate rhythm, no peripheral edema, warm, nontender. Eyes: Conjunctivae clear without exudates or hemorrhage Neck: Supple, no carotid bruits. Pulmonary: Clear to auscultation bilaterally   NEUROLOGICAL EXAM:  MENTAL STATUS: Speech:    Speech is normal; fluent and spontaneous with normal comprehension.  Cognition:     Orientation to time, place and person     Normal recent and remote memory     Normal Attention span and concentration     Normal  Language, naming, repeating,spontaneous speech     Fund of knowledge   CRANIAL NERVES: CN II: Visual fields are full to confrontation. Pupils are round equal and briskly reactive to light. CN III, IV, VI: extraocular movement are normal. No ptosis. CN V: Facial sensation is intact to light touch CN VII: Face is symmetric with normal eye closure  CN VIII: Hearing is normal to causal conversation. CN IX, X: Phonation is normal. CN XI: Head turning and shoulder shrug are intact  MOTOR: There is no pronator drift of out-stretched arms. Muscle  bulk and tone are normal. Muscle strength is normal.  REFLEXES: Reflexes are 2+ and symmetric at the biceps, triceps, knees, and ankles. Plantar responses are flexor.  SENSORY: Intact to light touch, pinprick and vibratory sensation are intact in fingers and toes.  COORDINATION: There is no trunk or limb dysmetria noted.  GAIT/STANCE: Posture is normal. Gait is steady with normal steps, base, arm swing, and turning. Heel and toe walking are normal. Tandem gait is normal.  Romberg is absent.   DIAGNOSTIC DATA (LABS, IMAGING, TESTING) - I reviewed patient records, labs, notes, testing and imaging myself where available.   ASSESSMENT AND PLAN  Sadira R Rodick is a 49 y.o. female   Neck pain, radiating pain to bilateral shoulder, bilateral arm paresthesia  Brisk reflex on examination,  MRI of cervical spine to rule out cervical pathology  Laboratory evaluations  EMG nerve conduction study to rule out left upper extremity neuropathy  Add on Cymbalta 60 mg daily   Marcial Pacas, M.D. Ph.D.  [facility] [address], [place], [place] [address] Ph: [phone] Fax: [phone]  CC: Lillard Anes, MD

## 2019-10-29 ENCOUNTER — Ambulatory Visit (INDEPENDENT_AMBULATORY_CARE_PROVIDER_SITE_OTHER): Payer: BC Managed Care – PPO | Admitting: Neurology

## 2019-10-29 ENCOUNTER — Other Ambulatory Visit: Payer: Self-pay | Admitting: Legal Medicine

## 2019-10-29 DIAGNOSIS — R202 Paresthesia of skin: Secondary | ICD-10-CM

## 2019-10-29 DIAGNOSIS — M542 Cervicalgia: Secondary | ICD-10-CM

## 2019-10-29 DIAGNOSIS — Z0289 Encounter for other administrative examinations: Secondary | ICD-10-CM

## 2019-10-29 NOTE — Progress Notes (Signed)
EMG report is under procedure tab. 

## 2019-10-29 NOTE — Procedures (Signed)
Full Name: Alison Simpson Gender: Female MRN #: [id number] Date of Birth: Jan 29, 1971    Visit Date: 10/29/2019 10:09 Age: 49 Years Examining Physician: Marcial Pacas, MD  Referring Physician: Marcial Pacas, MD Height: 5 feet 1 inch History: 49 years old female complains of intermittent left are paresthesia, freezing sensation.  Summary of the Tests:  Nerve Conduction Studies:  Bilateral median, ulnar sensory and motor responses were normal.  Electromyography: Selected needle examination of left upper extremity muscles and left cervical paraspinal muscles were normal.   Conclusion: This is a normal study. There is no evidence of left upper extremity neuropathy or left cervical radiculopathy.    ------------------------------- Marcial Pacas, M.D.Ph.D.  [facility] [address] [place], [place] [address] Tel: [phone] Fax: [phone]  Verbal informed consent was obtained from the patient, patient was informed of potential risk of procedure, including bruising, bleeding, hematoma formation, infection, muscle weakness, muscle pain, numbness, among others.        [place]    Nerve / Sites Muscle Latency Ref. Amplitude Ref. Rel Amp Segments Distance Velocity Ref. Area    ms ms mV mV %  cm m/s m/s mVms  R Median - APB     Wrist APB 2.9 ?4.4 9.9 ?4.0 100 Wrist - APB 7   38.2     Upper arm APB 5.8  9.6  96.3 Upper arm - Wrist 18 63 ?49 37.0  L Median - APB     Wrist APB 3.1 ?4.4 7.4 ?4.0 100 Wrist - APB 7   29.5     Upper arm APB 6.2  7.1  96.4 Upper arm - Wrist 18 58 ?49 29.9  R Ulnar - ADM     Wrist ADM 2.4 ?3.3 7.4 ?6.0 100 Wrist - ADM 7   20.4     B.Elbow ADM 5.0  7.1  96.3 B.Elbow - Wrist 18 70 ?49 21.3     A.Elbow ADM 6.5  7.6  107 A.Elbow - B.Elbow 10 67 ?49 22.9  L Ulnar - ADM     Wrist ADM 2.3 ?3.3 8.6 ?6.0 100 Wrist - ADM 7   23.6     B.Elbow ADM 5.0  8.1  94.9 B.Elbow - Wrist 18 68 ?49 23.5     A.Elbow ADM 6.5  8.0  98.7 A.Elbow - B.Elbow  10 64 ?49 23.2             SNC    Nerve / Sites Rec. Site Peak Lat Ref.  Amp Ref. Segments Distance    ms ms V V  cm  R Median - Orthodromic (Dig II, Mid palm)     Dig II Wrist 2.8 ?3.4 25 ?10 Dig II - Wrist 13  L Median - Orthodromic (Dig II, Mid palm)     Dig II Wrist 2.6 ?3.4 33 ?10 Dig II - Wrist 13  R Ulnar - Orthodromic, (Dig V, Mid palm)     Dig V Wrist 2.5 ?3.1 10 ?5 Dig V - Wrist 11  L Ulnar - Orthodromic, (Dig V, Mid palm)     Dig V Wrist 2.4 ?3.1 15 ?5 Dig V - Wrist 25             F  Wave    Nerve F Lat Ref.   ms ms  R Ulnar - ADM 25.3 ?32.0  L Ulnar - ADM 26.0 ?32.0         EMG Summary  Table    Spontaneous MUAP Recruitment  Muscle IA Fib PSW Fasc Other Amp Dur. Poly Pattern  L. Brachioradialis Normal None None None _______ Normal Normal Normal Normal  L. Pronator teres Normal None None None _______ Normal Normal Normal Normal  L. Extensor digitorum communis Normal None None None _______ Normal Normal Normal Normal  L. First dorsal interosseous Normal None None None _______ Normal Normal Normal Normal  L. Biceps brachii Normal None None None _______ Normal Normal Normal Normal  L. Deltoid Normal None None None _______ Normal Normal Normal Normal  L. Triceps brachii Normal None None None _______ Normal Normal Normal Normal  L. Cervical paraspinals Normal None None None _______ Normal Normal Normal Normal

## 2019-10-30 ENCOUNTER — Telehealth: Payer: Self-pay | Admitting: *Deleted

## 2019-10-30 LAB — TSH: TSH: 1.06 u[IU]/mL (ref 0.450–4.500)

## 2019-10-30 LAB — FOLATE: Folate: 18.4 ng/mL (ref >3.0)

## 2019-10-30 LAB — ANA W/REFLEX IF POSITIVE: Anti Nuclear Antibody (ANA): NEGATIVE

## 2019-10-30 LAB — VITAMIN B12: Vitamin B-12: 724 pg/mL (ref 232–1245)

## 2019-10-30 LAB — RPR: RPR Ser Ql: NONREACTIVE

## 2019-10-30 LAB — VITAMIN D 25 HYDROXY (VIT D DEFICIENCY, FRACTURES): Vit D, 25-Hydroxy: 28.1 ng/mL — ABNORMAL LOW (ref 30.0–100.0)

## 2019-10-30 LAB — B. BURGDORFI ANTIBODIES: Lyme IgG/IgM Ab: <0.91 {ISR} (ref 0.00–0.90)

## 2019-10-30 LAB — CK: Total CK: 41 U/L (ref 32–182)

## 2019-10-30 LAB — SEDIMENTATION RATE: Sed Rate: 6 mm/hr (ref 0–32)

## 2019-10-30 LAB — SAR COV2 SEROLOGY (COVID19)AB(IGG),IA: DiaSorin SARS-CoV-2 Ab, IgG: POSITIVE

## 2019-10-30 LAB — C-REACTIVE PROTEIN: CRP: 3 mg/L (ref 0–10)

## 2019-10-30 LAB — HIV ANTIBODY (ROUTINE TESTING W REFLEX): HIV Screen 4th Generation wRfx: NONREACTIVE

## 2019-10-30 NOTE — Telephone Encounter (Signed)
-----   Message from Levert Feinstein, MD sent at 10/30/2019  9:16 AM EDT ----- Please call patient for normal laboratory result, with exception of mildly low vit D level 28, she would benefit from over-counter Vit D3 supplement

## 2019-10-30 NOTE — Telephone Encounter (Signed)
Left patient a detailed message, with results and vitamin D3 recommendations, on voicemail (ok per DPR).  Provided our number to call back with any questions.

## 2019-11-04 ENCOUNTER — Ambulatory Visit: Payer: BC Managed Care – PPO | Admitting: Legal Medicine

## 2019-11-05 ENCOUNTER — Telehealth: Payer: Self-pay | Admitting: Neurology

## 2019-11-05 NOTE — Telephone Encounter (Signed)
Pt returned call and stated she would like to be called back at her work number [phone] Please ask for her when receptionist picks up.

## 2019-11-05 NOTE — Telephone Encounter (Signed)
LVM for pt to call back about scheduilng mri  [employer] Auth: [place] B

## 2019-11-05 NOTE — Telephone Encounter (Signed)
I spoke the patient she is scheduled at [facility] for 11/11/19.

## 2019-11-11 ENCOUNTER — Other Ambulatory Visit: Payer: Self-pay

## 2019-11-11 ENCOUNTER — Ambulatory Visit (INDEPENDENT_AMBULATORY_CARE_PROVIDER_SITE_OTHER): Payer: BC Managed Care – PPO

## 2019-11-11 DIAGNOSIS — M542 Cervicalgia: Secondary | ICD-10-CM | POA: Diagnosis not present

## 2019-11-11 DIAGNOSIS — R202 Paresthesia of skin: Secondary | ICD-10-CM

## 2019-11-12 NOTE — Telephone Encounter (Signed)
I contacted the pt and she and I discussed her questions. She wanted to see if the positive covid antibody could be r/t her recent covid vaccine. I advised this could be the reason the antibody came back. Pt reports she had her second dosage a few days prior to having blood test drawn. Pt had no other questions/concerns at this time.

## 2019-11-12 NOTE — Telephone Encounter (Signed)
Pt has called Megan,RN back re: what she understood my chart to state is that she has tested positive for SARS cov 2.  Pt is asking for a call to discuss this.  Pt states she is available now to speak with Aundra Millet, RN

## 2019-11-12 NOTE — Telephone Encounter (Signed)
lvm asking pt to call me back.

## 2019-11-12 NOTE — Telephone Encounter (Signed)
Patient called and requested a cb from RN to go over results provided work# states you can ask for Ayven.

## 2019-11-13 ENCOUNTER — Telehealth: Payer: Self-pay | Admitting: Neurology

## 2019-11-13 NOTE — Telephone Encounter (Signed)
Noted  

## 2019-11-13 NOTE — Telephone Encounter (Signed)
Pt states she had her MRI on Tues, she would like a call with the results just as soon as they are available.

## 2019-12-31 ENCOUNTER — Ambulatory Visit: Payer: BC Managed Care – PPO | Admitting: Neurology

## 2020-01-28 ENCOUNTER — Ambulatory Visit: Payer: BC Managed Care – PPO | Admitting: Neurology

## 2020-01-28 NOTE — Progress Notes (Deleted)
PATIENT: Alison Simpson DOB: 08-Aug-1970  REASON FOR VISIT: follow up HISTORY FROM: patient  HISTORY OF PRESENT ILLNESS: Today 01/28/20  HISTORY Sammy R Hartland is a 49 year old female, seen in request by her primary care physician Dr. Marina Goodell, [place] [facility] for evaluation of right upper extremity paresthesia, initial evaluation was on October 28, 2019.  I have reviewed and summarized the referring note from the referring physician.  She had a past medical history of hypertension, works as a Theatre manager, has close contact with COVID-19 patient, including her husband, but she was tested multiple times, all were negative  She received her first COVID-19 vaccination to right arm, March 10, there was no significant side effect then, on September 30, 2019, she woke up in the middle of the night with severe right arm pain from right shoulder down, deep achy, 10 out of 10, but she denied difficulty using her right arm, she actually presented to the emergency room, but waited long hours, went back home without being seen, the severe pain right arm last about 6 hours, next day, she noticed similar involvement to left arm but milder degree, she described it as freezing cold sensation, from left shoulder to all left fingers which has been persistent since its onset.  She was seen by her primary care physician next day, was treated with tapering dose of prednisone for 5 days, with mild improvement,  She received her second Pfizer COVID-19 vaccination on April 7 to right shoulder, a week later on October 15, 2019, she woke up with severe right below chest area pain, with nausea, ultrasound of abdomen was negative, her symptoms last about a week, she has no appetite, achy all over, low-grade fever to 100 last for 2 days, intermittent pain at different spots throughout her body, her symptoms improved with Phenergan as needed, Toradol, and some GI medications,  Since then, she also noticed mild  pressure headache on a daily basis,   October 26, 2019, she also noticed pinprick sensation when she urinated, short lasting, reported UA and urine culture was negative  Laboratory evaluation on October 21, 2019, normal C-reactive protein, urine analysis trace leukocyte, negative urine culture, lipase, CMP, CBC,  Update January 28, 2020 SS: Nerve conduction evaluation was normal.  No evidence of left upper extremity neuropathy or left cervical radiculopathy.  Laboratory evaluation showed mildly low vitamin D level 28, recommended over-the-counter supplement.  MRI of cervical spine showed no significant abnormalities  REVIEW OF SYSTEMS: Out of a complete 14 system review of symptoms, the patient complains only of the following symptoms, and all other reviewed systems are negative.  ALLERGIES: No Known Allergies  HOME MEDICATIONS: Outpatient Medications Prior to Visit  Medication Sig Dispense Refill  . albuterol (PROVENTIL) (2.5 MG/3ML) 0.083% nebulizer solution Take by nebulization as needed.     . Cholecalciferol (VITAMIN D3 PO) Take 1,000 Units by mouth daily.    . DULoxetine (CYMBALTA) 60 MG capsule Take 1 capsule (60 mg total) by mouth daily. 30 capsule 12  . montelukast (SINGULAIR) 10 MG tablet Take 1 tablet (10 mg total) by mouth at bedtime. 30 tablet 2  . olmesartan (BENICAR) 40 MG tablet Take 1 tablet (40 mg total) by mouth daily. 30 tablet 2   No facility-administered medications prior to visit.    PAST MEDICAL HISTORY: Past Medical History:  Diagnosis Date  . Arm pain   . Asthma   . Hypertension   . Pregnancy induced hypertension   . Tingling  PAST SURGICAL HISTORY: Past Surgical History:  Procedure Laterality Date  . TUBAL LIGATION     1999    FAMILY HISTORY: Family History  Problem Relation Age of Onset  . Cataracts Mother   . Other Father        unsure of medical history  . Diabetes Neg Hx   . Hypertension Neg Hx   . Coronary artery disease Neg Hx      SOCIAL HISTORY: Social History   Socioeconomic History  . Marital status: Married    Spouse name: Not on file  . Number of children: 1  . Years of education: some college  . Highest education level: Not on file  Occupational History  . Occupation: works w/ Print production planner day program     Employer: [employer]  Tobacco Use  . Smoking status: Never Smoker  . Smokeless tobacco: Never Used  Substance and Sexual Activity  . Alcohol use: No  . Drug use: No  . Sexual activity: Yes    Partners: Male  Other Topics Concern  . Not on file  Social History Narrative   Married, 1 biological child, 2 adopted children.   Employed full time. Walks 20 min twice/week.    Right-handed.   1-5 glasses of soft drinks per day.   Social Determinants of Health   Financial Resource Strain:   . Difficulty of Paying Living Expenses:   Food Insecurity:   . Worried About Programme researcher, broadcasting/film/video in the Last Year:   . Barista in the Last Year:   Transportation Needs:   . Freight forwarder (Medical):   Marland Kitchen Lack of Transportation (Non-Medical):   Physical Activity:   . Days of Exercise per Week:   . Minutes of Exercise per Session:   Stress:   . Feeling of Stress :   Social Connections:   . Frequency of Communication with Friends and Family:   . Frequency of Social Gatherings with Friends and Family:   . Attends Religious Services:   . Active Member of Clubs or Organizations:   . Attends Banker Meetings:   Marland Kitchen Marital Status:   Intimate Partner Violence:   . Fear of Current or Ex-Partner:   . Emotionally Abused:   Marland Kitchen Physically Abused:   . Sexually Abused:       PHYSICAL EXAM  There were no vitals filed for this visit. There is no height or weight on file to calculate BMI.  Generalized: Well developed, in no acute distress   Neurological examination  Mentation: Alert oriented to time, place, history taking. Follows all commands speech and language  fluent Cranial nerve II-XII: Pupils were equal round reactive to light. Extraocular movements were full, visual field were full on confrontational test. Facial sensation and strength were normal. Uvula tongue midline. Head turning and shoulder shrug  were normal and symmetric. Motor: The motor testing reveals 5 over 5 strength of all 4 extremities. Good symmetric motor tone is noted throughout.  Sensory: Sensory testing is intact to soft touch on all 4 extremities. No evidence of extinction is noted.  Coordination: Cerebellar testing reveals good finger-nose-finger and heel-to-shin bilaterally.  Gait and station: Gait is normal. Tandem gait is normal. Romberg is negative. No drift is seen.  Reflexes: Deep tendon reflexes are symmetric and normal bilaterally.   DIAGNOSTIC DATA (LABS, IMAGING, TESTING) - I reviewed patient records, labs, notes, testing and imaging myself where available.  Lab Results  Component Value Date  WBC 10.0 10/16/2019   HGB 14.6 10/16/2019   HCT 41.1 10/16/2019   MCV 83 10/16/2019   PLT 349 10/16/2019      Component Value Date/Time   NA 142 10/16/2019 1522   K 4.4 10/16/2019 1522   CL 103 10/16/2019 1522   CO2 23 10/16/2019 1522   GLUCOSE 92 10/16/2019 1522   BUN 9 10/16/2019 1522   CREATININE 0.64 10/16/2019 1522   CALCIUM 9.4 10/16/2019 1522   PROT 7.4 10/16/2019 1522   ALBUMIN 4.5 10/16/2019 1522   AST 18 10/16/2019 1522   ALT 26 10/16/2019 1522   ALKPHOS 73 10/16/2019 1522   BILITOT 0.2 10/16/2019 1522   GFRNONAA 107 10/16/2019 1522   GFRAA 123 10/16/2019 1522   No results found for: CHOL, HDL, LDLCALC, LDLDIRECT, TRIG, CHOLHDL No results found for: GYIR4W Lab Results  Component Value Date   VITAMINB12 724 10/28/2019   Lab Results  Component Value Date   TSH 1.060 10/28/2019      ASSESSMENT AND PLAN 49 y.o. year old female  has a past medical history of Arm pain, Asthma, Hypertension, Pregnancy induced hypertension, and Tingling. here  with:  1.  Neck pain, radiating pain to bilateral shoulder, bilateral arm paresthesia -MRI of cervical spine showed no significant abnormalities -Nerve conduction evaluation was normal, no evidence of left upper extremity neuropathy or left cervical radiculopathy  I spent 15 minutes with the patient. 50% of this time was spent   Margie Ege, [place], DNP 01/28/2020, 5:25 AM [facility] [address] [place], [place] [address] [phone]

## 2020-02-04 ENCOUNTER — Ambulatory Visit: Payer: BC Managed Care – PPO

## 2020-02-04 ENCOUNTER — Other Ambulatory Visit: Payer: Self-pay

## 2020-02-04 VITALS — BP 148/100

## 2020-02-04 DIAGNOSIS — I10 Essential (primary) hypertension: Secondary | ICD-10-CM

## 2020-02-04 NOTE — Progress Notes (Signed)
Patient came in to check blood pressure cuff to see if it was accurate, Patient states she has had a headache today and was getting a high blood pressure reading and wanted to check if her cuff was accurate.

## 2020-03-21 ENCOUNTER — Other Ambulatory Visit: Payer: Self-pay | Admitting: Legal Medicine

## 2020-04-29 ENCOUNTER — Ambulatory Visit (INDEPENDENT_AMBULATORY_CARE_PROVIDER_SITE_OTHER): Payer: BC Managed Care – PPO | Admitting: Legal Medicine

## 2020-04-29 ENCOUNTER — Encounter: Payer: Self-pay | Admitting: Legal Medicine

## 2020-04-29 ENCOUNTER — Other Ambulatory Visit: Payer: Self-pay

## 2020-04-29 DIAGNOSIS — M7521 Bicipital tendinitis, right shoulder: Secondary | ICD-10-CM | POA: Insufficient documentation

## 2020-04-29 NOTE — Progress Notes (Signed)
Subjective:  Patient ID: Alison Simpson, female    DOB: Nov 14, 1970  Age: 49 y.o. MRN: [id number]  Chief Complaint  Patient presents with  . Arm Pain    right    HPI: pain in right arm shoulder area with burning for 2 weeks.  Pain on movement. Severe pain last night lifting pan.  Then she had burning in shoulder.  She has pain in upper arm on right.   Current Outpatient Medications on File Prior to Visit  Medication Sig Dispense Refill  . albuterol (PROVENTIL) (2.5 MG/3ML) 0.083% nebulizer solution Take by nebulization as needed.     . DULoxetine (CYMBALTA) 60 MG capsule Take 1 capsule (60 mg total) by mouth daily. 30 capsule 12  . montelukast (SINGULAIR) 10 MG tablet Take 1 tablet (10 mg total) by mouth at bedtime. 30 tablet 2  . olmesartan (BENICAR) 40 MG tablet TAKE 1 TABLET(40 MG) BY MOUTH DAILY 30 tablet 2   No current facility-administered medications on file prior to visit.   Past Medical History:  Diagnosis Date  . Arm pain   . Asthma   . Hypertension   . Pregnancy induced hypertension   . Tingling    Past Surgical History:  Procedure Laterality Date  . TUBAL LIGATION     1999    Family History  Problem Relation Age of Onset  . Cataracts Mother   . Other Father        unsure of medical history  . Diabetes Neg Hx   . Hypertension Neg Hx   . Coronary artery disease Neg Hx    Social History   Socioeconomic History  . Marital status: Married    Spouse name: Not on file  . Number of children: 1  . Years of education: some college  . Highest education level: Not on file  Occupational History  . Occupation: works w/ Print production planner day program     Employer: [employer]  Tobacco Use  . Smoking status: Never Smoker  . Smokeless tobacco: Never Used  Substance and Sexual Activity  . Alcohol use: No  . Drug use: No  . Sexual activity: Yes    Partners: Male  Other Topics Concern  . Not on file  Social History Narrative   Married, 1 biological child, 2  adopted children.   Employed full time. Walks 20 min twice/week.    Right-handed.   1-5 glasses of soft drinks per day.   Social Determinants of Health   Financial Resource Strain:   . Difficulty of Paying Living Expenses: Not on file  Food Insecurity:   . Worried About Programme researcher, broadcasting/film/video in the Last Year: Not on file  . Ran Out of Food in the Last Year: Not on file  Transportation Needs:   . Lack of Transportation (Medical): Not on file  . Lack of Transportation (Non-Medical): Not on file  Physical Activity:   . Days of Exercise per Week: Not on file  . Minutes of Exercise per Session: Not on file  Stress:   . Feeling of Stress : Not on file  Social Connections:   . Frequency of Communication with Friends and Family: Not on file  . Frequency of Social Gatherings with Friends and Family: Not on file  . Attends Religious Services: Not on file  . Active Member of Clubs or Organizations: Not on file  . Attends Banker Meetings: Not on file  . Marital Status: Not on file  Review of Systems  Constitutional: Negative.   HENT: Negative.   Eyes: Negative.   Respiratory: Negative.   Cardiovascular: Negative.  Negative for chest pain, palpitations and leg swelling.  Gastrointestinal: Negative.   Musculoskeletal:       Pain right shoulder  Skin: Negative.   Psychiatric/Behavioral: Negative.      Objective:  BP 114/78 (BP Location: Left Arm, Patient Position: Sitting, Cuff Size: Normal)   Pulse 83   Temp (!) 97.4 F (36.3 C) (Temporal)   Ht 5\' 1"  (1.549 m)   Wt 151 lb (68.5 kg)   SpO2 97%   BMI 28.53 kg/m   BP/Weight 04/29/2020 02/04/2020 10/28/2019  Systolic BP 114 148 135  Diastolic BP 78 100 92  Wt. (Lbs) 151 - 151  BMI 28.53 - 28.53    Physical Exam Vitals reviewed.  Constitutional:      Appearance: Normal appearance.  HENT:     Head: Normocephalic and atraumatic.     Right Ear: Tympanic membrane normal.     Left Ear: Tympanic membrane normal.       Nose: Nose normal.     Mouth/Throat:     Mouth: Mucous membranes are moist.  Cardiovascular:     Rate and Rhythm: Normal rate and regular rhythm.     Pulses: Normal pulses.     Heart sounds: Normal heart sounds.  Pulmonary:     Effort: Pulmonary effort is normal.     Breath sounds: Normal breath sounds.  Musculoskeletal:        General: Tenderness present.     Comments: Pain right shoulder escecially over long arm of biceps.  She has full ROM of shoulder.  Some weakness of biceps.  Skin:    General: Skin is warm and dry.     Capillary Refill: Capillary refill takes less than 2 seconds.  Neurological:     General: No focal deficit present.     Mental Status: She is alert and oriented to person, place, and time.       Lab Results  Component Value Date   WBC 10.0 10/16/2019   HGB 14.6 10/16/2019   HCT 41.1 10/16/2019   PLT 349 10/16/2019   GLUCOSE 92 10/16/2019   ALT 26 10/16/2019   AST 18 10/16/2019   NA 142 10/16/2019   K 4.4 10/16/2019   CL 103 10/16/2019   CREATININE 0.64 10/16/2019   BUN 9 10/16/2019   CO2 23 10/16/2019   TSH 1.060 10/28/2019      Assessment & Plan:   1. Biceps tendonitis, right Patient has proximal tendonitis long arm of biceps.  The tendon sheath was injected with relief of pain.  Continue home exercises   After consent was obtained, using sterile technique the bicepts tendon sheath  was prepped and plain Ethyl Chloride was used as local anesthetic. The tendon sheath was entered and.  Steroid 40 mg and 1/2 ml plain Lidocaine was then injected and the needle withdrawn.  The procedure was well tolerated.  The patient is asked to continue to rest the joint for a few more days before resuming regular activities.  It may be more painful for the first 1-2 days.  Watch for fever, or increased swelling or persistent pain in the joint. Call or return to clinic prn if such symptoms occur or there is failure to improve as anticipated.     Follow-up:  Return in about 2 weeks (around 05/13/2020).  An After Visit Summary was printed and given to  the patient.  Brent Bulla [facility] [phone]

## 2020-05-14 ENCOUNTER — Other Ambulatory Visit: Payer: Self-pay | Admitting: Legal Medicine

## 2020-05-14 DIAGNOSIS — R0602 Shortness of breath: Secondary | ICD-10-CM

## 2020-05-17 ENCOUNTER — Other Ambulatory Visit: Payer: Self-pay

## 2020-05-17 DIAGNOSIS — R0602 Shortness of breath: Secondary | ICD-10-CM

## 2020-05-17 MED ORDER — OLMESARTAN MEDOXOMIL 40 MG PO TABS: 40.0000 mg | ORAL_TABLET | Freq: Every day | ORAL | 1 refills | Status: DC

## 2020-05-17 MED ORDER — MONTELUKAST SODIUM 10 MG PO TABS: 10.0000 mg | ORAL_TABLET | Freq: Every day | ORAL | 2 refills | Status: DC

## 2020-06-18 ENCOUNTER — Telehealth (INDEPENDENT_AMBULATORY_CARE_PROVIDER_SITE_OTHER): Payer: BC Managed Care – PPO | Admitting: Legal Medicine

## 2020-06-18 ENCOUNTER — Encounter: Payer: Self-pay | Admitting: Legal Medicine

## 2020-06-18 DIAGNOSIS — J4541 Moderate persistent asthma with (acute) exacerbation: Secondary | ICD-10-CM | POA: Diagnosis not present

## 2020-06-18 MED ORDER — PREDNISONE 10 MG (21) PO TBPK: ORAL_TABLET | ORAL | 0 refills | Status: DC

## 2020-06-18 MED ORDER — AMOXICILLIN-POT CLAVULANATE 875-125 MG PO TABS: 1.0000 | ORAL_TABLET | Freq: Two times a day (BID) | ORAL | 0 refills | Status: DC

## 2020-06-18 MED ORDER — BREZTRI AEROSPHERE 160-9-4.8 MCG/ACT IN AERO: 2.0000 | INHALATION_SPRAY | Freq: Two times a day (BID) | RESPIRATORY_TRACT | 1 refills | Status: DC

## 2020-06-18 NOTE — Progress Notes (Signed)
Virtual Visit via Telephone Note   This visit type was conducted due to national recommendations for restrictions regarding the COVID-19 Pandemic (e.g. social distancing) in an effort to limit this patient's exposure and mitigate transmission in our community.  Due to her co-morbid illnesses, this patient is at least at moderate risk for complications without adequate follow up.  This format is felt to be most appropriate for this patient at this time.  The patient did not have access to video technology/had technical difficulties with video requiring transitioning to audio format only (telephone).  All issues noted in this document were discussed and addressed.  No physical exam could be performed with this format.  Patient verbally consented to a telehealth visit.   Date:  06/18/2020   ID:  Alison Simpson, DOB July 11, 1970, MRN [id number]  Patient Location: Home Provider Location: Office/Clinic  PCP:  Abigail Miyamoto, MD   Evaluation Performed:  New Patient Evaluation  Chief Complaint:  Asthma exacerbation, negative cold and flu  History of Present Illness:    Alison Simpson is a 49 y.o. female with Asthma exacerbation, negative cold and flu.  With asthma exacerbation, she responds well to a muscarinic inhaler.  Breztri called in.We discussed her asthma and what to do if worsening.    The patient does not have symptoms concerning for COVID-19 infection (fever, chills, cough, or new shortness of breath).    Past Medical History:  Diagnosis Date  . Arm pain   . Asthma   . Hypertension   . Pregnancy induced hypertension   . Tingling     Past Surgical History:  Procedure Laterality Date  . TUBAL LIGATION     1999    Family History  Problem Relation Age of Onset  . Cataracts Mother   . Other Father        unsure of medical history  . Diabetes Neg Hx   . Hypertension Neg Hx   . Coronary artery disease Neg Hx     Social History   Socioeconomic History  . Marital  status: Married    Spouse name: Not on file  . Number of children: 1  . Years of education: some college  . Highest education level: Not on file  Occupational History  . Occupation: works w/ Print production planner day program     Employer: [employer]  Tobacco Use  . Smoking status: Never Smoker  . Smokeless tobacco: Never Used  Substance and Sexual Activity  . Alcohol use: No  . Drug use: No  . Sexual activity: Yes    Partners: Male  Other Topics Concern  . Not on file  Social History Narrative   Married, 1 biological child, 2 adopted children.   Employed full time. Walks 20 min twice/week.    Right-handed.   1-5 glasses of soft drinks per day.   Social Determinants of Health   Financial Resource Strain: Not on file  Food Insecurity: Not on file  Transportation Needs: Not on file  Physical Activity: Not on file  Stress: Not on file  Social Connections: Not on file  Intimate Partner Violence: Not on file    Outpatient Medications Prior to Visit  Medication Sig Dispense Refill  . albuterol (PROVENTIL) (2.5 MG/3ML) 0.083% nebulizer solution Take by nebulization as needed.     . DULoxetine (CYMBALTA) 60 MG capsule Take 1 capsule (60 mg total) by mouth daily. 30 capsule 12  . montelukast (SINGULAIR) 10 MG tablet Take 1 tablet (10  mg total) by mouth at bedtime. 90 tablet 2  . olmesartan (BENICAR) 40 MG tablet Take 1 tablet (40 mg total) by mouth daily. 90 tablet 1   No facility-administered medications prior to visit.    Allergies:   Patient has no known allergies.   Social History   Tobacco Use  . Smoking status: Never Smoker  . Smokeless tobacco: Never Used  Substance Use Topics  . Alcohol use: No  . Drug use: No     Review of Systems  Constitutional: Negative for chills and fever.  HENT: Positive for congestion and sinus pain. Negative for hearing loss.   Eyes: Negative for redness.  Respiratory: Positive for cough. Negative for hemoptysis and sputum production.    Cardiovascular: Negative for chest pain and palpitations.  Gastrointestinal: Negative for heartburn.  Genitourinary: Positive for dysuria. Negative for urgency.  Musculoskeletal: Negative for myalgias.  Neurological: Negative for headaches.     Labs/Other Tests and Data Reviewed:    Recent Labs: 10/16/2019: ALT 26; BUN 9; Creatinine, Ser 0.64; Hemoglobin 14.6; Platelets 349; Potassium 4.4; Sodium 142 10/28/2019: TSH 1.060   Recent Lipid Panel No results found for: CHOL, TRIG, HDL, CHOLHDL, LDLCALC, LDLDIRECT  Wt Readings from Last 3 Encounters:  04/29/20 151 lb (68.5 kg)  10/28/19 151 lb (68.5 kg)  10/21/19 147 lb 6.4 oz (66.9 kg)     Objective:    Vital Signs:  There were no vitals taken for this visit.   Physical Exam na  ASSESSMENT & PLAN:   Diagnoses and all orders for this visit: Moderate persistent asthma with acute exacerbation -     Budeson-Glycopyrrol-Formoterol (BREZTRI AEROSPHERE) 160-9-4.8 MCG/ACT AERO; Inhale 2 puffs into the lungs in the morning and at bedtime. -     predniSONE (STERAPRED UNI-PAK 21 TAB) 10 MG (21) TBPK tablet; Take 6ills first day , then 5 pills day 2 and then cut down one pill day until gone -     amoxicillin-clavulanate (AUGMENTIN) 875-125 MG tablet; Take 1 tablet by mouth 2 (two) times daily. Patient has exacerbation of asthma and we discussed treatment       COVID-19 Education: The signs and symptoms of COVID-19 were discussed with the patient and how to seek care for testing (follow up with PCP or arrange E-visit). The importance of social distancing was discussed today.   I spent 20 minutes dedicated to the care of this patient on the date of this encounter to include face-to-face time with the patient, as well LE:[id number] asthma care  Follow Up:  In Person prn  Signed,  Brent Bulla, MD  06/18/2020 11:23 AM    [facility] [place]

## 2020-06-29 ENCOUNTER — Telehealth: Payer: BC Managed Care – PPO | Admitting: Legal Medicine

## 2020-07-05 ENCOUNTER — Telehealth (INDEPENDENT_AMBULATORY_CARE_PROVIDER_SITE_OTHER): Payer: 59 | Admitting: Legal Medicine

## 2020-07-05 ENCOUNTER — Encounter: Payer: Self-pay | Admitting: Legal Medicine

## 2020-07-05 DIAGNOSIS — U071 COVID-19: Secondary | ICD-10-CM | POA: Diagnosis not present

## 2020-07-05 HISTORY — DX: COVID-19: U07.1

## 2020-07-05 LAB — POC COVID19 BINAXNOW: SARS Coronavirus 2 Ag: POSITIVE — AB

## 2020-07-05 MED ORDER — CHERATUSSIN AC 100-10 MG/5ML PO SOLN: 5.0000 mL | Freq: Three times a day (TID) | ORAL | 0 refills | Status: DC | PRN

## 2020-07-05 NOTE — Progress Notes (Signed)
Virtual Visit via Telephone Note   This visit type was conducted due to national recommendations for restrictions regarding the COVID-19 Pandemic (e.g. social distancing) in an effort to limit this patient's exposure and mitigate transmission in our community.  Due to her co-morbid illnesses, this patient is at least at moderate risk for complications without adequate follow up.  This format is felt to be most appropriate for this patient at this time.  The patient did not have access to video technology/had technical difficulties with video requiring transitioning to audio format only (telephone).  All issues noted in this document were discussed and addressed.  No physical exam could be performed with this format.  Patient verbally consented to a telehealth visit.   Date:  07/05/2020   ID:  Alison Simpson, DOB 1970-12-14, MRN [id number]  Patient Location: Home Provider Location: Office/Clinic  PCP:  Abigail Miyamoto, MD   Evaluation Performed:  New Patient Evaluation  Chief Complaint:  Saturday, feeling bad, congested headache  History of Present Illness:    LEGACI TARMAN is a 50 y.o. female with Saturday, feeling bad, congested headache can smell, no fever    The patient does have symptoms concerning for COVID-19 infection (fever, chills, cough, or new shortness of breath).    Past Medical History:  Diagnosis Date  . Arm pain   . Asthma   . Hypertension   . Pregnancy induced hypertension   . Tingling     Past Surgical History:  Procedure Laterality Date  . TUBAL LIGATION     1999    Family History  Problem Relation Age of Onset  . Cataracts Mother   . Other Father        unsure of medical history  . Diabetes Neg Hx   . Hypertension Neg Hx   . Coronary artery disease Neg Hx     Social History   Socioeconomic History  . Marital status: Married    Spouse name: Not on file  . Number of children: 1  . Years of education: some college  . Highest education  level: Not on file  Occupational History  . Occupation: works w/ Print production planner day program     Employer: [employer]  Tobacco Use  . Smoking status: Never Smoker  . Smokeless tobacco: Never Used  Substance and Sexual Activity  . Alcohol use: No  . Drug use: No  . Sexual activity: Yes    Partners: Male  Other Topics Concern  . Not on file  Social History Narrative   Married, 1 biological child, 2 adopted children.   Employed full time. Walks 20 min twice/week.    Right-handed.   1-5 glasses of soft drinks per day.   Social Determinants of Health   Financial Resource Strain: Not on file  Food Insecurity: Not on file  Transportation Needs: Not on file  Physical Activity: Not on file  Stress: Not on file  Social Connections: Not on file  Intimate Partner Violence: Not on file    Outpatient Medications Prior to Visit  Medication Sig Dispense Refill  . albuterol (PROVENTIL) (2.5 MG/3ML) 0.083% nebulizer solution Take by nebulization as needed.     . Budeson-Glycopyrrol-Formoterol (BREZTRI AEROSPHERE) 160-9-4.8 MCG/ACT AERO Inhale 2 puffs into the lungs in the morning and at bedtime. 10.7 g 1  . DULoxetine (CYMBALTA) 60 MG capsule Take 1 capsule (60 mg total) by mouth daily. 30 capsule 12  . montelukast (SINGULAIR) 10 MG tablet Take 1 tablet (10  mg total) by mouth at bedtime. 90 tablet 2  . olmesartan (BENICAR) 40 MG tablet Take 1 tablet (40 mg total) by mouth daily. 90 tablet 1  . amoxicillin-clavulanate (AUGMENTIN) 875-125 MG tablet Take 1 tablet by mouth 2 (two) times daily. 20 tablet 0  . predniSONE (STERAPRED UNI-PAK 21 TAB) 10 MG (21) TBPK tablet Take 6ills first day , then 5 pills day 2 and then cut down one pill day until gone 21 tablet 0   No facility-administered medications prior to visit.    Allergies:   Patient has no known allergies.   Social History   Tobacco Use  . Smoking status: Never Smoker  . Smokeless tobacco: Never Used  Substance Use Topics  .  Alcohol use: No  . Drug use: No     Review of Systems  Constitutional: Positive for fever and malaise/fatigue. Negative for chills.  HENT: Positive for sore throat.   Eyes: Negative for blurred vision.  Respiratory: Positive for cough.   Cardiovascular: Negative for chest pain, palpitations and orthopnea.  Gastrointestinal: Negative.   Genitourinary: Negative.   Musculoskeletal: Positive for myalgias.  Neurological: Positive for headaches.  Psychiatric/Behavioral: Negative.      Labs/Other Tests and Data Reviewed:    Recent Labs: 10/16/2019: ALT 26; BUN 9; Creatinine, Ser 0.64; Hemoglobin 14.6; Platelets 349; Potassium 4.4; Sodium 142 10/28/2019: TSH 1.060   Recent Lipid Panel No results found for: CHOL, TRIG, HDL, CHOLHDL, LDLCALC, LDLDIRECT  Wt Readings from Last 3 Encounters:  07/05/20 147 lb (66.7 kg)  04/29/20 151 lb (68.5 kg)  10/28/19 151 lb (68.5 kg)     Objective:    Vital Signs:  Pulse 82   Temp 99 F (37.2 C)   Ht 5\' 2"  (1.575 m)   Wt 147 lb (66.7 kg)   SpO2 95%   BMI 26.89 kg/m    Physical Exam   ASSESSMENT & PLAN:   Diagnoses and all orders for this visit: COVID-19 -     POC COVID-19 Patient has all covid shots but has been exposed at work We discussed positive covid, no work for one week.  She is not feeling bad and here O2 sat is good, she wants to avoid covid immunotherapy      COVID-19 Education: The signs and symptoms of COVID-19 were discussed with the patient and how to seek care for testing (follow up with PCP or arrange E-visit). The importance of social distancing was discussed today.   I spent dedicated to the care of this patient on the date of this encounter to include face-to-face time with the patient, as well as:   Follow Up:  In Person prn  Signed,  , MD  07/05/2020 9:34 AM    [facility]

## 2020-07-07 ENCOUNTER — Encounter: Payer: BC Managed Care – PPO | Admitting: Legal Medicine

## 2020-07-12 ENCOUNTER — Telehealth (INDEPENDENT_AMBULATORY_CARE_PROVIDER_SITE_OTHER): Payer: PRIVATE HEALTH INSURANCE | Admitting: Legal Medicine

## 2020-07-12 DIAGNOSIS — J45909 Unspecified asthma, uncomplicated: Secondary | ICD-10-CM

## 2020-07-12 DIAGNOSIS — Z5329 Procedure and treatment not carried out because of patient's decision for other reasons: Secondary | ICD-10-CM

## 2020-07-12 NOTE — Progress Notes (Signed)
Virtual Visit via Telephone Note   This visit type was conducted due to national recommendations for restrictions regarding the COVID-19 Pandemic (e.g. social distancing) in an effort to limit this patient's exposure and mitigate transmission in our community.  Due to her co-morbid illnesses, this patient is at least at moderate risk for complications without adequate follow up.  This format is felt to be most appropriate for this patient at this time.  The patient did not have access to video technology/had technical difficulties with video requiring transitioning to audio format only (telephone).  All issues noted in this document were discussed and addressed.  No physical exam could be performed with this format.  Patient verbally consented to a telehealth visit.   Date:  09/23/2020   ID:  Alison Simpson, DOB 12-25-1970, MRN [id number]  Patient Location: Home Provider Location: Office/Clinic  PCP:  Abigail Miyamoto, MD   Evaluation Performed:  New Patient Evaluation  Chief Complaint:  RTW  History of Present Illness:    Alison Simpson is a 50 y.o. female with patients asthma is doing well may RTW The patient does not have symptoms concerning for COVID-19 infection (fever, chills, cough, or new shortness of breath).    Past Medical History:  Diagnosis Date  . Arm pain   . Asthma   . Hypertension   . Pregnancy induced hypertension   . Tingling     Past Surgical History:  Procedure Laterality Date  . TUBAL LIGATION     1999    Family History  Problem Relation Age of Onset  . Cataracts Mother   . Other Father        unsure of medical history  . Diabetes Neg Hx   . Hypertension Neg Hx   . Coronary artery disease Neg Hx     Social History   Socioeconomic History  . Marital status: Married    Spouse name: Not on file  . Number of children: 1  . Years of education: some college  . Highest education level: Not on file  Occupational History  . Occupation: works  w/ Print production planner day program     Employer: [employer]  Tobacco Use  . Smoking status: Never Smoker  . Smokeless tobacco: Never Used  Substance and Sexual Activity  . Alcohol use: No  . Drug use: No  . Sexual activity: Yes    Partners: Male  Other Topics Concern  . Not on file  Social History Narrative   Married, 1 biological child, 2 adopted children.   Employed full time. Walks 20 min twice/week.    Right-handed.   1-5 glasses of soft drinks per day.   Social Determinants of Health   Financial Resource Strain: Not on file  Food Insecurity: Not on file  Transportation Needs: Not on file  Physical Activity: Not on file  Stress: Not on file  Social Connections: Not on file  Intimate Partner Violence: Not on file    Outpatient Medications Prior to Visit  Medication Sig Dispense Refill  . albuterol (PROVENTIL) (2.5 MG/3ML) 0.083% nebulizer solution Take by nebulization as needed.     . DULoxetine (CYMBALTA) 60 MG capsule Take 1 capsule (60 mg total) by mouth daily. 30 capsule 12  . montelukast (SINGULAIR) 10 MG tablet Take 1 tablet (10 mg total) by mouth at bedtime. 90 tablet 2  . olmesartan (BENICAR) 40 MG tablet Take 1 tablet (40 mg total) by mouth daily. 90 tablet 1  . Budeson-Glycopyrrol-Formoterol (  BREZTRI AEROSPHERE) 160-9-4.8 MCG/ACT AERO Inhale 2 puffs into the lungs in the morning and at bedtime. 10.7 g 1  . guaiFENesin-codeine (CHERATUSSIN AC) 100-10 MG/5ML syrup Take 5 mLs by mouth 3 (three) times daily as needed for cough. 120 mL 0   No facility-administered medications prior to visit.    Allergies:   Patient has no known allergies.   Social History   Tobacco Use  . Smoking status: Never Smoker  . Smokeless tobacco: Never Used  Substance Use Topics  . Alcohol use: No  . Drug use: No     Review of Systems  Constitutional: Negative.  Negative for chills.  HENT: Negative for sinus pain.   Eyes: Negative for redness.  Respiratory: Negative for cough and  shortness of breath.   Cardiovascular: Negative for chest pain.  Genitourinary: Negative for dysuria.  Musculoskeletal: Negative for myalgias.  Neurological: Negative.      Labs/Other Tests and Data Reviewed:    Recent Labs: 10/16/2019: ALT 26; BUN 9; Creatinine, Ser 0.64; Hemoglobin 14.6; Platelets 349; Potassium 4.4; Sodium 142 10/28/2019: TSH 1.060   Recent Lipid Panel No results found for: CHOL, TRIG, HDL, CHOLHDL, LDLCALC, LDLDIRECT  Wt Readings from Last 3 Encounters:  09/08/20 148 lb (67.1 kg)  08/25/20 146 lb (66.2 kg)  07/21/20 150 lb 12.8 oz (68.4 kg)     Objective:    Vital Signs:  There were no vitals taken for this visit.   Physical Exam   ASSESSMENT & PLAN:   Diagnoses and all orders for this visit: Moderate asthma, unspecified whether complicated, unspecified whether persistent Asthma resolving       COVID-19 Education: The signs and symptoms of COVID-19 were discussed with the patient and how to seek care for testing (follow up with PCP or arrange E-visit). The importance of social distancing was discussed today.   I spent 15 minutes dedicated to the care of this patient on the date of this encounter to include face-to-face time with the patient, as well as:   Follow Up:  In Person prn  Signed,  Brent Bulla, MD  09/23/2020 8:09 AM    [facility]

## 2020-07-12 NOTE — Progress Notes (Signed)
Virtual Visit via Telephone Note   This visit type was conducted due to national recommendations for restrictions regarding the COVID-19 Pandemic (e.g. social distancing) in an effort to limit this patient's exposure and mitigate transmission in our community.  Due to her co-morbid illnesses, this patient is at least at moderate risk for complications without adequate follow up.  This format is felt to be most appropriate for this patient at this time.  The patient did not have access to video technology/had technical difficulties with video requiring transitioning to audio format only (telephone).  All issues noted in this document were discussed and addressed.  No physical exam could be performed with this format.  Patient verbally consented to a telehealth visit.   Date:  07/12/2020   ID:  Alison Simpson, DOB 10-31-1970, MRN [id number]  Patient Location: Home Provider Location: Office/Clinic  PCP:  Abigail Miyamoto, MD   Evaluation Performed:  Follow-Up Visit  Chief Complaint:  Patient has covid, 5 day post test  History of Present Illness:    Alison Simpson is a 50 y.o. female with   The patient does not have symptoms concerning for COVID-19 infection (fever, chills, cough, or new shortness of breath).    Past Medical History:  Diagnosis Date  . Arm pain   . Asthma   . Hypertension   . Pregnancy induced hypertension   . Tingling     Past Surgical History:  Procedure Laterality Date  . TUBAL LIGATION     1999    Family History  Problem Relation Age of Onset  . Cataracts Mother   . Other Father        unsure of medical history  . Diabetes Neg Hx   . Hypertension Neg Hx   . Coronary artery disease Neg Hx     Social History   Socioeconomic History  . Marital status: Married    Spouse name: Not on file  . Number of children: 1  . Years of education: some college  . Highest education level: Not on file  Occupational History  . Occupation: works w/ Event organiser day program     Employer: [employer]  Tobacco Use  . Smoking status: Never Smoker  . Smokeless tobacco: Never Used  Substance and Sexual Activity  . Alcohol use: No  . Drug use: No  . Sexual activity: Yes    Partners: Male  Other Topics Concern  . Not on file  Social History Narrative   Married, 1 biological child, 2 adopted children.   Employed full time. Walks 20 min twice/week.    Right-handed.   1-5 glasses of soft drinks per day.   Social Determinants of Health   Financial Resource Strain: Not on file  Food Insecurity: Not on file  Transportation Needs: Not on file  Physical Activity: Not on file  Stress: Not on file  Social Connections: Not on file  Intimate Partner Violence: Not on file    Outpatient Medications Prior to Visit  Medication Sig Dispense Refill  . albuterol (PROVENTIL) (2.5 MG/3ML) 0.083% nebulizer solution Take by nebulization as needed.     . Budeson-Glycopyrrol-Formoterol (BREZTRI AEROSPHERE) 160-9-4.8 MCG/ACT AERO Inhale 2 puffs into the lungs in the morning and at bedtime. 10.7 g 1  . DULoxetine (CYMBALTA) 60 MG capsule Take 1 capsule (60 mg total) by mouth daily. 30 capsule 12  . guaiFENesin-codeine (CHERATUSSIN AC) 100-10 MG/5ML syrup Take 5 mLs by mouth 3 (three) times daily as needed  for cough. 120 mL 0  . montelukast (SINGULAIR) 10 MG tablet Take 1 tablet (10 mg total) by mouth at bedtime. 90 tablet 2  . olmesartan (BENICAR) 40 MG tablet Take 1 tablet (40 mg total) by mouth daily. 90 tablet 1   No facility-administered medications prior to visit.    Allergies:   Patient has no known allergies.   Social History   Tobacco Use  . Smoking status: Never Smoker  . Smokeless tobacco: Never Used  Substance Use Topics  . Alcohol use: No  . Drug use: No     ROS   Labs/Other Tests and Data Reviewed:    Recent Labs: 10/16/2019: ALT 26; BUN 9; Creatinine, Ser 0.64; Hemoglobin 14.6; Platelets 349; Potassium 4.4; Sodium  142 10/28/2019: TSH 1.060   Recent Lipid Panel No results found for: CHOL, TRIG, HDL, CHOLHDL, LDLCALC, LDLDIRECT  Wt Readings from Last 3 Encounters:  07/05/20 147 lb (66.7 kg)  04/29/20 151 lb (68.5 kg)  10/28/19 151 lb (68.5 kg)     Objective:    Vital Signs:  There were no vitals taken for this visit.   Physical Exam   ASSESSMENT & PLAN:   No show  No orders of the defined types were placed in this encounter.    No orders of the defined types were placed in this encounter.   COVID-19 Education: The signs and symptoms of COVID-19 were discussed with the patient and how to seek care for testing (follow up with PCP or arrange E-visit). The importance of social distancing was discussed today.   I spent < time > minutes dedicated to the care of this patient on the date of this encounter to include face-to-face time with the patient, as well as:   Follow Up:  In Person prn  Signed,  Brent Bulla, MD  07/12/2020 9:11 AM    [facility]

## 2020-07-21 ENCOUNTER — Other Ambulatory Visit: Payer: Self-pay

## 2020-07-21 ENCOUNTER — Ambulatory Visit (INDEPENDENT_AMBULATORY_CARE_PROVIDER_SITE_OTHER): Payer: 59 | Admitting: Legal Medicine

## 2020-07-21 ENCOUNTER — Encounter: Payer: Self-pay | Admitting: Legal Medicine

## 2020-07-21 DIAGNOSIS — R590 Localized enlarged lymph nodes: Secondary | ICD-10-CM | POA: Diagnosis not present

## 2020-07-21 DIAGNOSIS — Z0000 Encounter for general adult medical examination without abnormal findings: Secondary | ICD-10-CM | POA: Diagnosis not present

## 2020-07-21 DIAGNOSIS — J45909 Unspecified asthma, uncomplicated: Secondary | ICD-10-CM | POA: Diagnosis not present

## 2020-07-21 NOTE — Patient Instructions (Signed)
Preventive Care 84-49 Years Old, Female Preventive care refers to lifestyle choices and visits with your health care provider that can promote health and wellness. This includes:  A yearly physical exam. This is also called an annual wellness visit.  Regular dental and eye exams.  Immunizations.  Screening for certain conditions.  Healthy lifestyle choices, such as: ? Eating a healthy diet. ? Getting regular exercise. ? Not using drugs or products that contain nicotine and tobacco. ? Limiting alcohol use. What can I expect for my preventive care visit? Physical exam Your health care provider will check your:  Height and weight. These may be used to calculate your BMI (body mass index). BMI is a measurement that tells if you are at a healthy weight.  Heart rate and blood pressure.  Body temperature.  Skin for abnormal spots. Counseling Your health care provider may ask you questions about your:  Past medical problems.  Family's medical history.  Alcohol, tobacco, and drug use.  Emotional well-being.  Home life and relationship well-being.  Sexual activity.  Diet, exercise, and sleep habits.  Work and work Statistician.  Access to firearms.  Method of birth control.  Menstrual cycle.  Pregnancy history. What immunizations do I need? Vaccines are usually given at various ages, according to a schedule. Your health care provider will recommend vaccines for you based on your age, medical history, and lifestyle or other factors, such as travel or where you work.   What tests do I need? Blood tests  Lipid and cholesterol levels. These may be checked every 5 years, or more often if you are over 3 years old.  Hepatitis C test.  Hepatitis B test. Screening  Lung cancer screening. You may have this screening every year starting at age 73 if you have a 30-pack-year history of smoking and currently smoke or have quit within the past 15 years.  Colorectal cancer  screening. ? All adults should have this screening starting at age 52 and continuing until age 17. ? Your health care provider may recommend screening at age 49 if you are at increased risk. ? You will have tests every 1-10 years, depending on your results and the type of screening test.  Diabetes screening. ? This is done by checking your blood sugar (glucose) after you have not eaten for a while (fasting). ? You may have this done every 1-3 years.  Mammogram. ? This may be done every 1-2 years. ? Talk with your health care provider about when you should start having regular mammograms. This may depend on whether you have a family history of breast cancer.  BRCA-related cancer screening. This may be done if you have a family history of breast, ovarian, tubal, or peritoneal cancers.  Pelvic exam and Pap test. ? This may be done every 3 years starting at age 10. ? Starting at age 11, this may be done every 5 years if you have a Pap test in combination with an HPV test. Other tests  STD (sexually transmitted disease) testing, if you are at risk.  Bone density scan. This is done to screen for osteoporosis. You may have this scan if you are at high risk for osteoporosis. Talk with your health care provider about your test results, treatment options, and if necessary, the need for more tests. Follow these instructions at home: Eating and drinking  Eat a diet that includes fresh fruits and vegetables, whole grains, lean protein, and low-fat dairy products.  Take vitamin and mineral supplements  as recommended by your health care provider.  Do not drink alcohol if: ? Your health care provider tells you not to drink. ? You are pregnant, may be pregnant, or are planning to become pregnant.  If you drink alcohol: ? Limit how much you have to 0-1 drink a day. ? Be aware of how much alcohol is in your drink. In the [place] one drink equals one 12 oz bottle of beer (355 mL), one 5 oz glass of  wine (148 mL), or one 1 oz glass of hard liquor (44 mL).   Lifestyle  Take daily care of your teeth and gums. Brush your teeth every morning and night with fluoride toothpaste. Floss one time each day.  Stay active. Exercise for at least 30 minutes 5 or more days each week.  Do not use any products that contain nicotine or tobacco, such as cigarettes, e-cigarettes, and chewing tobacco. If you need help quitting, ask your health care provider.  Do not use drugs.  If you are sexually active, practice safe sex. Use a condom or other form of protection to prevent STIs (sexually transmitted infections).  If you do not wish to become pregnant, use a form of birth control. If you plan to become pregnant, see your health care provider for a prepregnancy visit.  If told by your health care provider, take low-dose aspirin daily starting at age 50.  Find healthy ways to cope with stress, such as: ? Meditation, yoga, or listening to music. ? Journaling. ? Talking to a trusted person. ? Spending time with friends and family. Safety  Always wear your seat belt while driving or riding in a vehicle.  Do not drive: ? If you have been drinking alcohol. Do not ride with someone who has been drinking. ? When you are tired or distracted. ? While texting.  Wear a helmet and other protective equipment during sports activities.  If you have firearms in your house, make sure you follow all gun safety procedures. What's next?  Visit your health care provider once a year for an annual wellness visit.  Ask your health care provider how often you should have your eyes and teeth checked.  Stay up to date on all vaccines. This information is not intended to replace advice given to you by your health care provider. Make sure you discuss any questions you have with your health care provider. Document Revised: 03/23/2020 Document Reviewed: 02/28/2018 Elsevier Patient Education  2021 [employer]  

## 2020-07-21 NOTE — Progress Notes (Signed)
Subjective:  Patient ID: Alison Simpson, female    DOB: Aug 08, 1970  Age: 50 y.o. MRN: [id number]  Chief Complaint  Patient presents with  . Annual Exam    CPE    HPI Well Adult Physical: Patient here for a comprehensive physical exam.The patient reports problems - asthma Do you take any herbs or supplements that were not prescribed by a doctor? no Are you taking calcium supplements? no Are you taking aspirin daily? no  Encounter for general adult medical examination without abnormal findings  Physical ("At Risk" items are starred): Patient's last physical exam was 1 year ago .  Smoking: Life-long non-smoker ;  Physical Activity: Exercises at least 3 times per week ;  Alcohol/Drug Use: Is a non-drinker ; No illicit drug use ;  Patient is not afflicted from Stress Incontinence and Urge Incontinence  Safety: reviewed. Patient wears a seat belt, has smoke detectors, has carbon monoxide detectors, practices appropriate gun safety, and wears sunscreen with extended sun exposure. Dental Care: biannual cleanings, brushes and flosses daily. Ophthalmology/Optometry: Annual visit.  Hearing loss: none Vision impairments: none  Menarche: 10 Menstrual History: stpped LMP: 1 year ago Pregnancy history: G1P1 Safe at home:yes Self breast exams: yes  breztri now drying her out.  We will stop and change to symbicort.    Flowsheet Row Office Visit from 04/29/2020 in [facility]  PHQ-2 Total Score 0              Social Hx   Social History   Socioeconomic History  . Marital status: Married    Spouse name: Not on file  . Number of children: 1  . Years of education: some college  . Highest education level: Not on file  Occupational History  . Occupation: works w/ Print production planner day program     Employer: [employer]  Tobacco Use  . Smoking status: Never Smoker  . Smokeless tobacco: Never Used  Substance and Sexual Activity  . Alcohol use: No  . Drug use: No  . Sexual  activity: Yes    Partners: Male  Other Topics Concern  . Not on file  Social History Narrative   Married, 1 biological child, 2 adopted children.   Employed full time. Walks 20 min twice/week.    Right-handed.   1-5 glasses of soft drinks per day.   Social Determinants of Health   Financial Resource Strain: Not on file  Food Insecurity: Not on file  Transportation Needs: Not on file  Physical Activity: Not on file  Stress: Not on file  Social Connections: Not on file   Past Medical History:  Diagnosis Date  . Arm pain   . Asthma   . Hypertension   . Pregnancy induced hypertension   . Tingling    Past Surgical History:  Procedure Laterality Date  . TUBAL LIGATION     1999    Family History  Problem Relation Age of Onset  . Cataracts Mother   . Other Father        unsure of medical history  . Diabetes Neg Hx   . Hypertension Neg Hx   . Coronary artery disease Neg Hx     Review of Systems  Constitutional: Negative for activity change, appetite change and fatigue.  HENT: Negative for congestion and sinus pain.   Eyes: Negative for visual disturbance.  Respiratory: Negative for apnea, chest tightness, shortness of breath and wheezing.   Cardiovascular: Negative for chest pain and leg swelling.  Gastrointestinal: Negative for abdominal distention and abdominal pain.  Endocrine: Negative for polyuria.  Genitourinary: Negative for difficulty urinating and urgency.  Musculoskeletal: Negative for arthralgias and back pain.  Neurological: Negative for dizziness and headaches.  Psychiatric/Behavioral: Negative.      Objective:  BP 114/62 (BP Location: Left Arm, Patient Position: Sitting, Cuff Size: Normal)   Pulse 80   Temp 98 F (36.7 C) (Temporal)   Resp 16   Ht 5\' 2"  (1.575 m)   Wt 150 lb 12.8 oz (68.4 kg)   SpO2 97%   BMI 27.58 kg/m   BP/Weight 07/21/2020 07/05/2020 04/29/2020  Systolic BP 114 - 114  Diastolic BP 62 - 78  Wt. (Lbs) 150.8 147 151  BMI  27.58 26.89 28.53    Physical Exam Vitals reviewed.  Constitutional:      Appearance: Normal appearance.  HENT:     Head: Normocephalic.     Right Ear: Tympanic membrane, ear canal and external ear normal.     Left Ear: Tympanic membrane, ear canal and external ear normal.     Mouth/Throat:     Mouth: Mucous membranes are moist.     Pharynx: Oropharynx is clear.  Eyes:     Extraocular Movements: Extraocular movements intact.     Conjunctiva/sclera: Conjunctivae normal.     Pupils: Pupils are equal, round, and reactive to light.  Cardiovascular:     Rate and Rhythm: Normal rate and regular rhythm.     Pulses: Normal pulses.     Heart sounds: Normal heart sounds. No murmur heard. No gallop.   Pulmonary:     Effort: Pulmonary effort is normal.     Breath sounds: Normal breath sounds.  Abdominal:     General: Abdomen is flat. Bowel sounds are normal. There is no distension.     Palpations: Abdomen is soft.     Tenderness: There is no abdominal tenderness.  Lymphadenopathy:     Cervical: Cervical adenopathy present.  Skin:    General: Skin is warm.     Capillary Refill: Capillary refill takes less than 2 seconds.  Neurological:     General: No focal deficit present.     Mental Status: She is alert.     Lab Results  Component Value Date   WBC 10.0 10/16/2019   HGB 14.6 10/16/2019   HCT 41.1 10/16/2019   PLT 349 10/16/2019   GLUCOSE 92 10/16/2019   ALT 26 10/16/2019   AST 18 10/16/2019   NA 142 10/16/2019   K 4.4 10/16/2019   CL 103 10/16/2019   CREATININE 0.64 10/16/2019   BUN 9 10/16/2019   CO2 23 10/16/2019   TSH 1.060 10/28/2019      Assessment & Plan:  1. Routine general medical examination at a health care facility Complete physical performed for work physical  2. Moderate asthma, unspecified whether complicated, unspecified whether persistent This patient has asthma mild and is on albuterol.  Patient is not having a flair.  Chronic medicines include  breztri- stopped. Addition new medicines none.  Asthma action plan is in place.   3. Cervical adenopathy - 10/30/2019 Soft Tissue Head/Neck (NON-THYROID); Future New cervical adenopathy after Covid that is enlarging.  Needs ultrasound to characterize    Body mass index is 27.58 kg/m.   These are the goals we discussed: Goals    . Cut out extra servings        This is a list of the screening recommended for you and due dates:  Health Maintenance  Topic Date Due  .  Hepatitis C: One time screening is recommended by Center for Disease Control  (CDC) for  adults born from [birth year] through 1964.   Never done  . Tetanus Vaccine  Never done  . Pap Smear  Never done  . Colon Cancer Screening  Never done  . COVID-19 Vaccine (3 - Booster for Pfizer series) 04/08/2020  . Flu Shot  Completed  . HIV Screening  Completed     AN INDIVIDUALIZED CARE PLAN: was established or reinforced today.   SELF MANAGEMENT: The patient and I together assessed ways to personally work towards obtaining the recommended goals  Support needs The patient and/or family needs were assessed and services were offered if appropriate.  No orders of the defined types were placed in this encounter.   Follow-up: Return if symptoms worsen or fail to improve.  An After Visit Summary was printed and given to the patient.  Brent Bulla, MD [facility] [phone]

## 2020-07-30 ENCOUNTER — Other Ambulatory Visit: Payer: Self-pay

## 2020-07-30 DIAGNOSIS — R590 Localized enlarged lymph nodes: Secondary | ICD-10-CM

## 2020-08-19 ENCOUNTER — Ambulatory Visit: Payer: 59 | Admitting: Legal Medicine

## 2020-08-25 ENCOUNTER — Encounter: Payer: Self-pay | Admitting: Legal Medicine

## 2020-08-25 ENCOUNTER — Ambulatory Visit (INDEPENDENT_AMBULATORY_CARE_PROVIDER_SITE_OTHER): Payer: 59 | Admitting: Legal Medicine

## 2020-08-25 ENCOUNTER — Other Ambulatory Visit: Payer: Self-pay

## 2020-08-25 DIAGNOSIS — M7501 Adhesive capsulitis of right shoulder: Secondary | ICD-10-CM | POA: Diagnosis not present

## 2020-08-25 MED ORDER — HYDROCODONE-ACETAMINOPHEN 10-325 MG PO TABS: 1.0000 | ORAL_TABLET | Freq: Three times a day (TID) | ORAL | 0 refills | Status: AC | PRN

## 2020-08-25 NOTE — Patient Instructions (Signed)
Shoulder Exercises Ask your health care provider which exercises are safe for you. Do exercises exactly as told by your health care provider and adjust them as directed. It is normal to feel mild stretching, pulling, tightness, or discomfort as you do these exercises. Stop right away if you feel sudden pain or your pain gets worse. Do not begin these exercises until told by your health care provider. Stretching exercises External rotation and abduction This exercise is sometimes called corner stretch. This exercise rotates your arm outward (external rotation) and moves your arm out from your body (abduction). 1. Stand in a doorway with one of your feet slightly in front of the other. This is called a staggered stance. If you cannot reach your forearms to the door frame, stand facing a corner of a room. 2. Choose one of the following positions as told by your health care provider: ? Place your hands and forearms on the door frame above your head. ? Place your hands and forearms on the door frame at the height of your head. ? Place your hands on the door frame at the height of your elbows. 3. Slowly move your weight onto your front foot until you feel a stretch across your chest and in the front of your shoulders. Keep your head and chest upright and keep your abdominal muscles tight. 4. Hold for __________ seconds. 5. To release the stretch, shift your weight to your back foot. Repeat __________ times. Complete this exercise __________ times a day.   Extension, standing 1. Stand and hold a broomstick, a cane, or a similar object behind your back. ? Your hands should be a little wider than shoulder width apart. ? Your palms should face away from your back. 2. Keeping your elbows straight and your shoulder muscles relaxed, move the stick away from your body until you feel a stretch in your shoulders (extension). ? Avoid shrugging your shoulders while you move the stick. Keep your shoulder blades  tucked down toward the middle of your back. 3. Hold for __________ seconds. 4. Slowly return to the starting position. Repeat __________ times. Complete this exercise __________ times a day. Range-of-motion exercises Pendulum 1. Stand near a wall or a surface that you can hold onto for balance. 2. Bend at the waist and let your left / right arm hang straight down. Use your other arm to support you. Keep your back straight and do not lock your knees. 3. Relax your left / right arm and shoulder muscles, and move your hips and your trunk so your left / right arm swings freely. Your arm should swing because of the motion of your body, not because you are using your arm or shoulder muscles. 4. Keep moving your hips and trunk so your arm swings in the following directions, as told by your health care provider: ? Side to side. ? Forward and backward. ? In clockwise and counterclockwise circles. 5. Continue each motion for __________ seconds, or for as long as told by your health care provider. 6. Slowly return to the starting position. Repeat __________ times. Complete this exercise __________ times a day.   Shoulder flexion, standing 1. Stand and hold a broomstick, a cane, or a similar object. Place your hands a little more than shoulder width apart on the object. Your left / right hand should be palm up, and your other hand should be palm down. 2. Keep your elbow straight and your shoulder muscles relaxed. Push the stick up with your healthy   arm to raise your left / right arm in front of your body, and then over your head until you feel a stretch in your shoulder (flexion). ? Avoid shrugging your shoulder while you raise your arm. Keep your shoulder blade tucked down toward the middle of your back. 3. Hold for __________ seconds. 4. Slowly return to the starting position. Repeat __________ times. Complete this exercise __________ times a day.   Shoulder abduction, standing 1. Stand and hold a  broomstick, a cane, or a similar object. Place your hands a little more than shoulder width apart on the object. Your left / right hand should be palm up, and your other hand should be palm down. 2. Keep your elbow straight and your shoulder muscles relaxed. Push the object across your body toward your left / right side. Raise your left / right arm to the side of your body (abduction) until you feel a stretch in your shoulder. ? Do not raise your arm above shoulder height unless your health care provider tells you to do that. ? If directed, raise your arm over your head. ? Avoid shrugging your shoulder while you raise your arm. Keep your shoulder blade tucked down toward the middle of your back. 3. Hold for __________ seconds. 4. Slowly return to the starting position. Repeat __________ times. Complete this exercise __________ times a day. Internal rotation 1. Place your left / right hand behind your back, palm up. 2. Use your other hand to dangle an exercise band, a towel, or a similar object over your shoulder. Grasp the band with your left / right hand so you are holding on to both ends. 3. Gently pull up on the band until you feel a stretch in the front of your left / right shoulder. The movement of your arm toward the center of your body is called internal rotation. ? Avoid shrugging your shoulder while you raise your arm. Keep your shoulder blade tucked down toward the middle of your back. 4. Hold for __________ seconds. 5. Release the stretch by letting go of the band and lowering your hands. Repeat __________ times. Complete this exercise __________ times a day.   Strengthening exercises External rotation 1. Sit in a stable chair without armrests. 2. Secure an exercise band to a stable object at elbow height on your left / right side. 3. Place a soft object, such as a folded towel or a small pillow, between your left / right upper arm and your body to move your elbow about 4 inches (10  cm) away from your side. 4. Hold the end of the exercise band so it is tight and there is no slack. 5. Keeping your elbow pressed against the soft object, slowly move your forearm out, away from your abdomen (external rotation). Keep your body steady so only your forearm moves. 6. Hold for __________ seconds. 7. Slowly return to the starting position. Repeat __________ times. Complete this exercise __________ times a day.   Shoulder abduction 1. Sit in a stable chair without armrests, or stand up. 2. Hold a __________ weight in your left / right hand, or hold an exercise band with both hands. 3. Start with your arms straight down and your left / right palm facing in, toward your body. 4. Slowly lift your left / right hand out to your side (abduction). Do not lift your hand above shoulder height unless your health care provider tells you that this is safe. ? Keep your arms straight. ? Avoid   shrugging your shoulder while you do this movement. Keep your shoulder blade tucked down toward the middle of your back. 5. Hold for __________ seconds. 6. Slowly lower your arm, and return to the starting position. Repeat __________ times. Complete this exercise __________ times a day.   Shoulder extension 1. Sit in a stable chair without armrests, or stand up. 2. Secure an exercise band to a stable object in front of you so it is at shoulder height. 3. Hold one end of the exercise band in each hand. Your palms should face each other. 4. Straighten your elbows and lift your hands up to shoulder height. 5. Step back, away from the secured end of the exercise band, until the band is tight and there is no slack. 6. Squeeze your shoulder blades together as you pull your hands down to the sides of your thighs (extension). Stop when your hands are straight down by your sides. Do not let your hands go behind your body. 7. Hold for __________ seconds. 8. Slowly return to the starting position. Repeat __________  times. Complete this exercise __________ times a day. Shoulder row 1. Sit in a stable chair without armrests, or stand up. 2. Secure an exercise band to a stable object in front of you so it is at waist height. 3. Hold one end of the exercise band in each hand. Position your palms so that your thumbs are facing the ceiling (neutral position). 4. Bend each of your elbows to a 90-degree angle (right angle) and keep your upper arms at your sides. 5. Step back until the band is tight and there is no slack. 6. Slowly pull your elbows back behind you. 7. Hold for __________ seconds. 8. Slowly return to the starting position. Repeat __________ times. Complete this exercise __________ times a day. Shoulder press-ups 1. Sit in a stable chair that has armrests. Sit upright, with your feet flat on the floor. 2. Put your hands on the armrests so your elbows are bent and your fingers are pointing forward. Your hands should be about even with the sides of your body. 3. Push down on the armrests and use your arms to lift yourself off the chair. Straighten your elbows and lift yourself up as much as you comfortably can. ? Move your shoulder blades down, and avoid letting your shoulders move up toward your ears. ? Keep your feet on the ground. As you get stronger, your feet should support less of your body weight as you lift yourself up. 4. Hold for __________ seconds. 5. Slowly lower yourself back into the chair. Repeat __________ times. Complete this exercise __________ times a day.   Wall push-ups 1. Stand so you are facing a stable wall. Your feet should be about one arm-length away from the wall. 2. Lean forward and place your palms on the wall at shoulder height. 3. Keep your feet flat on the floor as you bend your elbows and lean forward toward the wall. 4. Hold for __________ seconds. 5. Straighten your elbows to push yourself back to the starting position. Repeat __________ times. Complete this  exercise __________ times a day.   This information is not intended to replace advice given to you by your health care provider. Make sure you discuss any questions you have with your health care provider. Document Revised: 10/11/2018 Document Reviewed: 07/19/2018 [employer] Patient Education  2021 [employer]  

## 2020-08-25 NOTE — Progress Notes (Signed)
Acute Office Visit  Subjective:    Patient ID: Alison Simpson, female    DOB: [birth year], 50 y.o.   MRN: [id number]  Chief Complaint  Patient presents with  . Shoulder Pain    HPI Patient is in today for right shoulder pain x 6 months or longer which comes and goes. 2 months ago states she had a cortisone shot which did not help. 2 years ago had similar symptoms and was referred to neuro who prescribed cymbalta which help some however she is still having pain. Pain radiates down her arm. Describes pain as a ache but at times "feels like arm is in a meat grinder". Complains of decreased ROM. Over the weekend daughter had to wash her hair because she could not. She is a caretaker for other people. She saw Dr. Terrace Arabia, neurologist with negative cervical MRI and negative NCS.  It improved with cymbalta. Now worse over last few weeks, she has lack of mobility.  Past Medical History:  Diagnosis Date  . Arm pain   . Asthma   . Hypertension   . Pregnancy induced hypertension   . Tingling     Past Surgical History:  Procedure Laterality Date  . TUBAL LIGATION     1999    Family History  Problem Relation Age of Onset  . Cataracts Mother   . Other Father        unsure of medical history  . Diabetes Neg Hx   . Hypertension Neg Hx   . Coronary artery disease Neg Hx     Social History   Socioeconomic History  . Marital status: Married    Spouse name: Not on file  . Number of children: 1  . Years of education: some college  . Highest education level: Not on file  Occupational History  . Occupation: works w/ Print production planner day program     Employer: [employer]  Tobacco Use  . Smoking status: Never Smoker  . Smokeless tobacco: Never Used  Substance and Sexual Activity  . Alcohol use: No  . Drug use: No  . Sexual activity: Yes    Partners: Male  Other Topics Concern  . Not on file  Social History Narrative   Married, 1 biological child, 2 adopted children.   Employed full  time. Walks 20 min twice/week.    Right-handed.   1-5 glasses of soft drinks per day.   Social Determinants of Health   Financial Resource Strain: Not on file  Food Insecurity: Not on file  Transportation Needs: Not on file  Physical Activity: Not on file  Stress: Not on file  Social Connections: Not on file  Intimate Partner Violence: Not on file    Outpatient Medications Prior to Visit  Medication Sig Dispense Refill  . albuterol (PROVENTIL) (2.5 MG/3ML) 0.083% nebulizer solution Take by nebulization as needed.     . DULoxetine (CYMBALTA) 60 MG capsule Take 1 capsule (60 mg total) by mouth daily. 30 capsule 12  . montelukast (SINGULAIR) 10 MG tablet Take 1 tablet (10 mg total) by mouth at bedtime. 90 tablet 2  . olmesartan (BENICAR) 40 MG tablet Take 1 tablet (40 mg total) by mouth daily. 90 tablet 1  . Budeson-Glycopyrrol-Formoterol (BREZTRI AEROSPHERE) 160-9-4.8 MCG/ACT AERO Inhale 2 puffs into the lungs in the morning and at bedtime. 10.7 g 1  . guaiFENesin-codeine (CHERATUSSIN AC) 100-10 MG/5ML syrup Take 5 mLs by mouth 3 (three) times daily as needed for cough. 120 mL 0  No facility-administered medications prior to visit.    No Known Allergies  Review of Systems  Constitutional: Negative for activity change and appetite change.  HENT: Negative for congestion and sinus pain.   Eyes: Negative for visual disturbance.  Respiratory: Negative for chest tightness and shortness of breath.   Cardiovascular: Negative for chest pain, palpitations and leg swelling.  Endocrine: Negative for polyuria.  Genitourinary: Negative for dysuria.  Musculoskeletal: Negative for arthralgias (right shoulder).  Skin: Negative.   Neurological: Negative.   Psychiatric/Behavioral: Negative.        Objective:    Physical Exam Vitals reviewed.  Constitutional:      Appearance: Normal appearance.  HENT:     Head: Normocephalic and atraumatic.     Right Ear: Tympanic membrane normal.      Left Ear: Tympanic membrane normal.     Mouth/Throat:     Mouth: Mucous membranes are moist.     Pharynx: Oropharynx is clear.  Eyes:     Extraocular Movements: Extraocular movements intact.     Conjunctiva/sclera: Conjunctivae normal.     Pupils: Pupils are equal, round, and reactive to light.  Cardiovascular:     Rate and Rhythm: Normal rate and regular rhythm.     Pulses: Normal pulses.     Heart sounds: No murmur heard. No gallop.   Pulmonary:     Effort: Pulmonary effort is normal. No respiratory distress.     Breath sounds: No rales.  Abdominal:     General: Abdomen is flat. Bowel sounds are normal.     Palpations: Abdomen is soft.  Musculoskeletal:        General: Tenderness present. Normal range of motion.     Right shoulder: Tenderness present.       Arms:     Cervical back: Normal range of motion and neck supple.     Comments: Abduction to 40 degrees, flexion to 60 degrees, rotation 45 degrees  Skin:    General: Skin is warm.     Capillary Refill: Capillary refill takes less than 2 seconds.  Neurological:     General: No focal deficit present.     Mental Status: She is alert and oriented to person, place, and time.  Psychiatric:        Mood and Affect: Mood normal.        Thought Content: Thought content normal.        Judgment: Judgment normal.     BP 132/80   Pulse 80   Temp (!) 95.8 F (35.4 C)   Ht 5\' 2"  (1.575 m)   Wt 146 lb (66.2 kg)   SpO2 99%   BMI 26.70 kg/m  Wt Readings from Last 3 Encounters:  08/25/20 146 lb (66.2 kg)  07/21/20 150 lb 12.8 oz (68.4 kg)  07/05/20 147 lb (66.7 kg)    Health Maintenance Due  Topic Date Due  . Hepatitis C Screening  Never done  . TETANUS/TDAP  Never done  . PAP SMEAR-Modifier  Never done  . COLONOSCOPY (Pts 45-41yrs Insurance coverage will need to be confirmed)  Never done  . COVID-19 Vaccine (3 - Booster for Pfizer series) 04/08/2020    There are no preventive care reminders to display for this  patient.   Lab Results  Component Value Date   TSH 1.060 10/28/2019   Lab Results  Component Value Date   WBC 10.0 10/16/2019   HGB 14.6 10/16/2019   HCT 41.1 10/16/2019   MCV 83 10/16/2019  PLT 349 10/16/2019   Lab Results  Component Value Date   NA 142 10/16/2019   K 4.4 10/16/2019   CO2 23 10/16/2019   GLUCOSE 92 10/16/2019   BUN 9 10/16/2019   CREATININE 0.64 10/16/2019   BILITOT 0.2 10/16/2019   ALKPHOS 73 10/16/2019   AST 18 10/16/2019   ALT 26 10/16/2019   PROT 7.4 10/16/2019   ALBUMIN 4.5 10/16/2019   CALCIUM 9.4 10/16/2019   No results found for: CHOL No results found for: HDL No results found for: LDLCALC No results found for: TRIG No results found for: CHOLHDL No results found for: YTKZ6W     Assessment & Plan:  Diagnoses and all orders for this visit: Adhesive capsulitis of right shoulder -     DG Arthro Shoulder Right -     HYDROcodone-acetaminophen (NORCO) 10-325 MG tablet; Take 1 tablet by mouth every 8 (eight) hours as needed for up to 5 days. -     Ambulatory referral to Physical Therapy  Start PT and get x-ray, she may have impingement       I spent 30 minutes dedicated to the care of this patient on the date of this encounter to include face-to-face time with the patient, as well as:   Follow-up: Return in about 2 weeks (around 09/08/2020) for shoulder.  An After Visit Summary was printed and given to the patient.  Brent Bulla, MD [facility] [phone]

## 2020-09-08 ENCOUNTER — Encounter: Payer: Self-pay | Admitting: Legal Medicine

## 2020-09-08 ENCOUNTER — Ambulatory Visit (INDEPENDENT_AMBULATORY_CARE_PROVIDER_SITE_OTHER): Payer: 59 | Admitting: Legal Medicine

## 2020-09-08 ENCOUNTER — Other Ambulatory Visit: Payer: Self-pay

## 2020-09-08 VITALS — BP 130/80 | HR 85 | Temp 95.7°F | Ht 62.0 in | Wt 148.0 lb

## 2020-09-08 DIAGNOSIS — M7501 Adhesive capsulitis of right shoulder: Secondary | ICD-10-CM | POA: Diagnosis not present

## 2020-09-08 NOTE — Progress Notes (Signed)
Subjective:  Patient ID: Alison Simpson, female    DOB: Oct 09, 1970  Age: 50 y.o. MRN: [id number]  Chief Complaint  Patient presents with  . Right Shoulder Pain    HPI Shoulder pain-PT does not accept her insurance, at home exercises have helped. X-ray was normal,   She has progressed in ROM but still pain. Current Outpatient Medications on File Prior to Visit  Medication Sig Dispense Refill  . albuterol (PROVENTIL) (2.5 MG/3ML) 0.083% nebulizer solution Take by nebulization as needed.     . DULoxetine (CYMBALTA) 60 MG capsule Take 1 capsule (60 mg total) by mouth daily. 30 capsule 12  . montelukast (SINGULAIR) 10 MG tablet Take 1 tablet (10 mg total) by mouth at bedtime. 90 tablet 2  . olmesartan (BENICAR) 40 MG tablet Take 1 tablet (40 mg total) by mouth daily. 90 tablet 1   No current facility-administered medications on file prior to visit.   Past Medical History:  Diagnosis Date  . Arm pain   . Asthma   . Hypertension   . Pregnancy induced hypertension   . Tingling    Past Surgical History:  Procedure Laterality Date  . TUBAL LIGATION     1999    Family History  Problem Relation Age of Onset  . Cataracts Mother   . Other Father        unsure of medical history  . Diabetes Neg Hx   . Hypertension Neg Hx   . Coronary artery disease Neg Hx    Social History   Socioeconomic History  . Marital status: Married    Spouse name: Not on file  . Number of children: 1  . Years of education: some college  . Highest education level: Not on file  Occupational History  . Occupation: works w/ Print production planner day program     Employer: [employer]  Tobacco Use  . Smoking status: Never Smoker  . Smokeless tobacco: Never Used  Substance and Sexual Activity  . Alcohol use: No  . Drug use: No  . Sexual activity: Yes    Partners: Male  Other Topics Concern  . Not on file  Social History Narrative   Married, 1 biological child, 2 adopted children.   Employed full time.  Walks 20 min twice/week.    Right-handed.   1-5 glasses of soft drinks per day.   Social Determinants of Health   Financial Resource Strain: Not on file  Food Insecurity: Not on file  Transportation Needs: Not on file  Physical Activity: Not on file  Stress: Not on file  Social Connections: Not on file    Review of Systems  Constitutional: Negative for chills, diaphoresis (At night), fatigue and fever.  HENT: Negative for congestion, ear pain, rhinorrhea and sore throat.   Respiratory: Negative for cough and shortness of breath.   Cardiovascular: Negative for chest pain.  Gastrointestinal: Negative for abdominal pain, constipation, diarrhea, nausea and vomiting.  Genitourinary: Negative for dysuria and urgency.  Musculoskeletal: Positive for arthralgias (shoulder). Negative for back pain and myalgias.  Skin: Negative.   Neurological: Positive for dizziness and headaches. Negative for weakness and light-headedness.  Psychiatric/Behavioral: Negative for dysphoric mood. The patient is not nervous/anxious.      Objective:  BP 130/80   Pulse 85   Temp (!) 95.7 F (35.4 C)   Ht 5\' 2"  (1.575 m)   Wt 148 lb (67.1 kg)   SpO2 98%   BMI 27.07 kg/m   BP/Weight 09/08/2020  08/25/2020 07/21/2020  Systolic BP 130 132 114  Diastolic BP 80 80 62  Wt. (Lbs) 148 146 150.8  BMI 27.07 26.7 27.58    Physical Exam Vitals reviewed.  Constitutional:      Appearance: Normal appearance.  Cardiovascular:     Rate and Rhythm: Normal rate and regular rhythm.     Pulses: Normal pulses.     Heart sounds: Normal heart sounds.  Pulmonary:     Effort: Pulmonary effort is normal.     Breath sounds: Normal breath sounds.  Musculoskeletal:     Right shoulder: Tenderness present. Decreased range of motion. Decreased strength.       Arms:     Comments: Abduct 45 degrees, flexion 45 degrees, limited rotation.  Neurological:     Mental Status: She is alert.       Lab Results  Component Value  Date   WBC 10.0 10/16/2019   HGB 14.6 10/16/2019   HCT 41.1 10/16/2019   PLT 349 10/16/2019   GLUCOSE 92 10/16/2019   ALT 26 10/16/2019   AST 18 10/16/2019   NA 142 10/16/2019   K 4.4 10/16/2019   CL 103 10/16/2019   CREATININE 0.64 10/16/2019   BUN 9 10/16/2019   CO2 23 10/16/2019   TSH 1.060 10/28/2019      Assessment & Plan:  Diagnoses and all orders for this visit: Adhesive capsulitis of right shoulder -     Ambulatory referral to Physical Therapy Patient continues to have right shoulder pain and limited ROM.  Needs PT covered in [facility] network.  Right shoulder injected.  After consent was obtained, using sterile technique the right shoulder was prepped and plain Lidocaine 1% was used as local anesthetic. The joint was entered and  Steroid 80 mg and 3 ml plain Lidocaine was then injected and the needle withdrawn.  The procedure was well tolerated.  The patient is asked to continue to rest the joint for a few more days before resuming regular activities.  It may be more painful for the first 1-2 days.  Watch for fever, or increased swelling or persistent pain in the joint. Call or return to clinic prn if such symptoms occur or there is failure to improve as anticipated.       I spent 20 minutes dedicated to the care of this patient on the date of this encounter to include face-to-face time with the patient, as well as:   Follow-up: Return in about 1 month (around 10/09/2020).  An After Visit Summary was printed and given to the patient.  Brent Bulla, MD [facility] [phone]

## 2020-09-23 ENCOUNTER — Encounter: Payer: Self-pay | Admitting: Legal Medicine

## 2020-09-26 ENCOUNTER — Encounter: Payer: Self-pay | Admitting: Legal Medicine

## 2020-10-12 ENCOUNTER — Ambulatory Visit: Payer: 59 | Admitting: Legal Medicine

## 2020-10-28 ENCOUNTER — Other Ambulatory Visit: Payer: Self-pay | Admitting: Neurology

## 2020-12-06 ENCOUNTER — Telehealth: Payer: Self-pay

## 2020-12-06 ENCOUNTER — Other Ambulatory Visit: Payer: Self-pay | Admitting: Neurology

## 2020-12-06 ENCOUNTER — Encounter: Payer: Self-pay | Admitting: Family Medicine

## 2020-12-06 ENCOUNTER — Telehealth (INDEPENDENT_AMBULATORY_CARE_PROVIDER_SITE_OTHER): Payer: 59 | Admitting: Family Medicine

## 2020-12-06 ENCOUNTER — Other Ambulatory Visit: Payer: Self-pay | Admitting: Family Medicine

## 2020-12-06 VITALS — Temp 99.2°F

## 2020-12-06 DIAGNOSIS — J040 Acute laryngitis: Secondary | ICD-10-CM

## 2020-12-06 DIAGNOSIS — J4541 Moderate persistent asthma with (acute) exacerbation: Secondary | ICD-10-CM

## 2020-12-06 MED ORDER — FLOVENT HFA 220 MCG/ACT IN AERO: 2.0000 | INHALATION_SPRAY | Freq: Two times a day (BID) | RESPIRATORY_TRACT | 12 refills | Status: DC

## 2020-12-06 MED ORDER — BUDESONIDE-FORMOTEROL FUMARATE 160-4.5 MCG/ACT IN AERO: 2.0000 | INHALATION_SPRAY | Freq: Two times a day (BID) | RESPIRATORY_TRACT | 3 refills | Status: DC

## 2020-12-06 MED ORDER — AZITHROMYCIN 250 MG PO TABS: ORAL_TABLET | ORAL | 0 refills | Status: DC

## 2020-12-06 MED ORDER — PREDNISONE 10 MG PO TABS: ORAL_TABLET | ORAL | 0 refills | Status: AC

## 2020-12-06 NOTE — Progress Notes (Signed)
Virtual Visit via Telephone Note   This visit type was conducted due to national recommendations for restrictions regarding the COVID-19 Pandemic (e.g. social distancing) in an effort to limit this patient's exposure and mitigate transmission in our community.  Due to her co-morbid illnesses, this patient is at least at moderate risk for complications without adequate follow up.  This format is felt to be most appropriate for this patient at this time.  The patient did not have access to video technology/had technical difficulties with video requiring transitioning to audio format only (telephone).  All issues noted in this document were discussed and addressed.  No physical exam could be performed with this format.  Patient verbally consented to a telehealth visit.   Date:  12/06/2020   ID:  Alison Simpson, DOB 30-Jun-1971, MRN [id number]  Patient Location: Home Provider Location: Office/Clinic  PCP:  Abigail Miyamoto, MD   Evaluation Performed: acute visit  Chief Complaint:  cough  History of Present Illness:    Alison Simpson is a 50 y.o. female c/o sore throat that started Friday. Hoarse. Asthma flared up 7 days ago. Has been taking dayquil and tylenol which has helped some with her  Sore throat. Has been sleeping in recliner due to congestion and PND. Cough is dry however when she is coughing she can not get "it" up. At home covid test on Saturday and today both resulted as negative.  Past Medical History:  Diagnosis Date  . Arm pain   . Asthma   . Hypertension   . Pregnancy induced hypertension   . Tingling     Past Surgical History:  Procedure Laterality Date  . TUBAL LIGATION     1999    Family History  Problem Relation Age of Onset  . Cataracts Mother   . Other Father        unsure of medical history  . Diabetes Neg Hx   . Hypertension Neg Hx   . Coronary artery disease Neg Hx     Social History   Socioeconomic History  . Marital status: Married     Spouse name: Not on file  . Number of children: 1  . Years of education: some college  . Highest education level: Not on file  Occupational History  . Occupation: works w/ Print production planner day program     Employer: [employer]  Tobacco Use  . Smoking status: Never Smoker  . Smokeless tobacco: Never Used  Substance and Sexual Activity  . Alcohol use: No  . Drug use: No  . Sexual activity: Yes    Partners: Male  Other Topics Concern  . Not on file  Social History Narrative   Married, 1 biological child, 2 adopted children.   Employed full time. Walks 20 min twice/week.    Right-handed.   1-5 glasses of soft drinks per day.   Social Determinants of Health   Financial Resource Strain: Not on file  Food Insecurity: Not on file  Transportation Needs: Not on file  Physical Activity: Not on file  Stress: Not on file  Social Connections: Not on file  Intimate Partner Violence: Not on file    Outpatient Medications Prior to Visit  Medication Sig Dispense Refill  . albuterol (PROVENTIL) (2.5 MG/3ML) 0.083% nebulizer solution Take by nebulization as needed.     . DULoxetine (CYMBALTA) 60 MG capsule Take 1 capsule (60 mg total) by mouth daily. 30 capsule 12  . montelukast (SINGULAIR) 10 MG tablet Take  1 tablet (10 mg total) by mouth at bedtime. 90 tablet 2  . olmesartan (BENICAR) 40 MG tablet Take 1 tablet (40 mg total) by mouth daily. 90 tablet 1   No facility-administered medications prior to visit.    Allergies:   Patient has no known allergies.   Social History   Tobacco Use  . Smoking status: Never Smoker  . Smokeless tobacco: Never Used  Substance Use Topics  . Alcohol use: No  . Drug use: No     Review of Systems  Constitutional: Positive for malaise/fatigue. Negative for chills and fever.  HENT: Positive for congestion and sore throat.   Respiratory: Positive for cough (Dry). Negative for sputum production and shortness of breath.   Cardiovascular: Negative for  chest pain.  Gastrointestinal: Negative for diarrhea, nausea and vomiting.  Musculoskeletal: Negative for back pain and myalgias.  Neurological: Positive for headaches.     Labs/Other Tests and Data Reviewed:    Recent Labs: No results found for requested labs within last 8760 hours.   Recent Lipid Panel No results found for: CHOL, TRIG, HDL, CHOLHDL, LDLCALC, LDLDIRECT  Wt Readings from Last 3 Encounters:  09/08/20 148 lb (67.1 kg)  08/25/20 146 lb (66.2 kg)  07/21/20 150 lb 12.8 oz (68.4 kg)     Objective:    Vital Signs:  Temp 99.2 F (37.3 C)    Physical Exam  Hoarse. Congested cough.  ASSESSMENT & PLAN:   1. Moderate persistent asthma with acute exacerbation - predniSONE (DELTASONE) 10 MG tablet; Take 6 tablets (60 mg total) by mouth daily with breakfast for 1 day, THEN 5 tablets (50 mg total) daily with breakfast for 1 day, THEN 4 tablets (40 mg total) daily with breakfast for 1 day, THEN 3 tablets (30 mg total) daily with breakfast for 1 day, THEN 2 tablets (20 mg total) daily with breakfast for 1 day, THEN 1 tablet (10 mg total) daily with breakfast for 1 day.  Dispense: 21 tablet; Refill: 0 - budesonide-formoterol (SYMBICORT) 160-4.5 MCG/ACT inhaler; Inhale 2 puffs into the lungs 2 (two) times daily.  Dispense: 1 each; Refill: 3  2. Laryngitis, acute - azithromycin (ZITHROMAX) 250 MG tablet; 2 DAILY FOR FIRST DAY, THEN DECREASE TO ONE DAILY FOR 4 MORE DAYS.  Dispense: 6 tablet; Refill: 0   Recommend stat out of work until Wednesday. May call back for a note if needed.    Meds ordered this encounter  Medications  . azithromycin (ZITHROMAX) 250 MG tablet    Sig: 2 DAILY FOR FIRST DAY, THEN DECREASE TO ONE DAILY FOR 4 MORE DAYS.    Dispense:  6 tablet    Refill:  0  . predniSONE (DELTASONE) 10 MG tablet    Sig: Take 6 tablets (60 mg total) by mouth daily with breakfast for 1 day, THEN 5 tablets (50 mg total) daily with breakfast for 1 day, THEN 4 tablets (40 mg  total) daily with breakfast for 1 day, THEN 3 tablets (30 mg total) daily with breakfast for 1 day, THEN 2 tablets (20 mg total) daily with breakfast for 1 day, THEN 1 tablet (10 mg total) daily with breakfast for 1 day.    Dispense:  21 tablet    Refill:  0  . budesonide-formoterol (SYMBICORT) 160-4.5 MCG/ACT inhaler    Sig: Inhale 2 puffs into the lungs 2 (two) times daily.    Dispense:  1 each    Refill:  3    I spent 9 minutes dedicated  to the care of this patient on the date of this encounter to include face-to-face.  Follow Up:  In Person prn  Signed,  Blane Ohara, MD  12/06/2020 12:09 PM    [facility] [place]

## 2020-12-06 NOTE — Telephone Encounter (Signed)
Pt left VM stating the Symbicort sent in is too expensive. Asking for alternative.   Lorita Officer, [place] 12/06/20 3:29 PM

## 2020-12-06 NOTE — Telephone Encounter (Signed)
Sending flovent. Kc

## 2020-12-06 NOTE — Telephone Encounter (Signed)
Patient informed. 

## 2020-12-07 ENCOUNTER — Other Ambulatory Visit: Payer: Self-pay

## 2020-12-07 ENCOUNTER — Other Ambulatory Visit: Payer: Self-pay | Admitting: Family Medicine

## 2020-12-07 MED ORDER — ADVAIR HFA 45-21 MCG/ACT IN AERO: 2.0000 | INHALATION_SPRAY | Freq: Two times a day (BID) | RESPIRATORY_TRACT | 12 refills | Status: DC

## 2020-12-07 MED ORDER — FLUTICASONE-SALMETEROL 250-50 MCG/ACT IN AEPB: 1.0000 | INHALATION_SPRAY | Freq: Two times a day (BID) | RESPIRATORY_TRACT | 2 refills | Status: DC

## 2020-12-07 NOTE — Telephone Encounter (Signed)
Pt called and states the pharmacy never received flovent. Pt called insurance and insurance prefers Advair diskus that will cost $25. Pt would like this if possible  [place], CCMA 12/07/20 10:12 AM

## 2020-12-07 NOTE — Telephone Encounter (Signed)
Sent advair. Pt notified.

## 2020-12-12 ENCOUNTER — Other Ambulatory Visit: Payer: Self-pay | Admitting: Legal Medicine

## 2020-12-14 ENCOUNTER — Encounter: Payer: Self-pay | Admitting: Nurse Practitioner

## 2020-12-14 ENCOUNTER — Ambulatory Visit (INDEPENDENT_AMBULATORY_CARE_PROVIDER_SITE_OTHER): Payer: 59 | Admitting: Nurse Practitioner

## 2020-12-14 VITALS — BP 118/62 | HR 103 | Temp 97.7°F | Ht 62.0 in | Wt 145.0 lb

## 2020-12-14 DIAGNOSIS — J029 Acute pharyngitis, unspecified: Secondary | ICD-10-CM | POA: Diagnosis not present

## 2020-12-14 DIAGNOSIS — J4541 Moderate persistent asthma with (acute) exacerbation: Secondary | ICD-10-CM | POA: Diagnosis not present

## 2020-12-14 DIAGNOSIS — R059 Cough, unspecified: Secondary | ICD-10-CM

## 2020-12-14 DIAGNOSIS — R509 Fever, unspecified: Secondary | ICD-10-CM

## 2020-12-14 LAB — POCT RAPID STREP A: Strep A Ag: NOT DETECTED

## 2020-12-14 LAB — POC COVID19 BINAXNOW: SARS Coronavirus 2 Ag: NEGATIVE

## 2020-12-14 MED ORDER — AMOXICILLIN-POT CLAVULANATE 875-125 MG PO TABS: 1.0000 | ORAL_TABLET | Freq: Two times a day (BID) | ORAL | 0 refills | Status: DC

## 2020-12-14 MED ORDER — PROMETHAZINE-DM 6.25-15 MG/5ML PO SYRP: 5.0000 mL | ORAL_SOLUTION | Freq: Four times a day (QID) | ORAL | 0 refills | Status: DC | PRN

## 2020-12-14 NOTE — Patient Instructions (Addendum)
Obtain chest x-ray at [facility] today We will call you with lab and x-ray results Begin Augmentin twice daily for 10 days Use inhalers as directed, rinse mouth afterwards Do not drive,use power machinery, or drink alcohol when taking prescription cough medication Seek emergency medical care for worsening shortness of breath Follow-up as needed  Shortness of Breath, Adult Shortness of breath means you have trouble breathing. Shortness of breath couldbe a sign of a medical problem. Follow these instructions at home:  Watch for any changes in your symptoms. Do not use any products that contain nicotine or tobacco, such as cigarettes, e-cigarettes, and chewing tobacco. Do not smoke. Smoking can cause shortness of breath. If you need help to quit smoking, ask your doctor. Avoid things that can make it harder to breathe, such as: Mold. Dust. Air pollution. Chemical smells. Things that can cause allergy symptoms (allergens), if you have allergies. Keep your living space clean. Use products that help remove mold and dust. Rest as needed. Slowly return to your normal activities. Take over-the-counter and prescription medicines only as told by your doctor. This includes oxygen therapy and inhaled medicines. Keep all follow-up visits as told by your doctor. This is important. Contact a doctor if: Your condition does not get better as soon as expected. You have a hard time doing your normal activities, even after you rest. You have new symptoms. Get help right away if: Your shortness of breath gets worse. You have trouble breathing when you are resting. You feel light-headed or you pass out (faint). You have a cough that is not helped by medicines. You cough up blood. You have pain with breathing. You have pain in your chest, arms, shoulders, or belly (abdomen). You have a fever. You cannot walk up stairs. You cannot exercise the way you normally do. These symptoms may represent a  serious problem that is an emergency. Do not wait to see if the symptoms will go away. Get medical help right away. Call your local emergency services (911 in the [place] Do not drive yourself to the hospital. Summary Shortness of breath is when you have trouble breathing enough air. It can be a sign of a medical problem. Avoid things that make it hard for you to breathe, such as smoking, pollution, mold, and dust. Watch for any changes in your symptoms. Contact your doctor if you do not get better or you get worse. This information is not intended to replace advice given to you by your health care provider. Make sure you discuss any questions you have with your healthcare provider. Document Revised: 11/19/2017 Document Reviewed: 11/19/2017 Elsevier Patient Education  2022 [employer] Inc. Cough, Adult A cough helps to clear your throat and lungs. A cough may be a sign of anillness or another medical condition. An acute cough may only last 2-3 weeks, while a chronic cough may last 8 ormore weeks. Many things can cause a cough. They include: Germs (viruses or bacteria) that attack the airway. Breathing in things that bother (irritate) your lungs. Allergies. Asthma. Mucus that runs down the back of your throat (postnasal drip). Smoking. Acid backing up from the stomach into the tube that moves food from the mouth to the stomach (gastroesophageal reflux). Some medicines. Lung problems. Other medical conditions, such as heart failure or a blood clot in the lung (pulmonary embolism). Follow these instructions at home: Medicines Take over-the-counter and prescription medicines only as told by your doctor. Talk with your doctor before you take medicines that stop  a cough (cough suppressants). Lifestyle  Do not smoke, and try not to be around smoke. Do not use any products that contain nicotine or tobacco, such as cigarettes, e-cigarettes, and chewing tobacco. If you need help quitting, ask your  doctor. Drink enough fluid to keep your pee (urine) pale yellow. Avoid caffeine. Do not drink alcohol if your doctor tells you not to drink.  General instructions  Watch for any changes in your cough. Tell your doctor about them. Always cover your mouth when you cough. Stay away from things that make you cough, such as perfume, candles, campfire smoke, or cleaning products. If the air is dry, use a cool mist vaporizer or humidifier in your home. If your cough is worse at night, try using extra pillows to raise your head up higher while you sleep. Rest as needed. Keep all follow-up visits as told by your doctor. This is important.  Contact a doctor if: You have new symptoms. You cough up pus. Your cough does not get better after 2-3 weeks, or your cough gets worse. Cough medicine does not help your cough and you are not sleeping well. You have pain that gets worse or pain that is not helped with medicine. You have a fever. You are losing weight and you do not know why. You have night sweats. Get help right away if: You cough up blood. You have trouble breathing. Your heartbeat is very fast. These symptoms may be an emergency. Do not wait to see if the symptoms will go away. Get medical help right away. Call your local emergency services (911 in the [place] Do not drive yourself to the hospital. Summary A cough helps to clear your throat and lungs. Many things can cause a cough. Take over-the-counter and prescription medicines only as told by your doctor. Always cover your mouth when you cough. Contact a doctor if you have new symptoms or you have a cough that does not get better or gets worse. This information is not intended to replace advice given to you by your health care provider. Make sure you discuss any questions you have with your healthcare provider. Document Revised: 08/08/2019 Document Reviewed: 07/08/2018 Elsevier Patient Education  2022 [employer].

## 2020-12-14 NOTE — Progress Notes (Signed)
Acute Office Visit  Subjective:    Patient ID: Alison Simpson, female    DOB: 08/11/1970, 50 y.o.   MRN: [id number]  Chief Complaint  Patient presents with   Cough    Chest tight  Onset of symptoms was 10-days ago.   HPI [name] is a 50 year old Caucasian female that presents with dyspnea, cough, body aches, and fever. States cough is non-productive and persistent. She states cough has caused her to gag and feel nauseous. States she has been unable to sleep due to cough. Onset of symptoms was 10-days ago. Treatment includes a course of Prednisone, Z-pack,  Advair inhaler, Albuterol inhaler and nebulizer. She is prescribed Singulair 10 mg daily. She states she was diagnosed with asthma as an adult.    Past Medical History:  Diagnosis Date   Arm pain    Asthma    Hypertension    Pregnancy induced hypertension    Tingling     Past Surgical History:  Procedure Laterality Date   TUBAL LIGATION     1999    Family History  Problem Relation Age of Onset   Cataracts Mother    Other Father        unsure of medical history   Diabetes Neg Hx    Hypertension Neg Hx    Coronary artery disease Neg Hx     Social History   Socioeconomic History   Marital status: Married    Spouse name: Not on file   Number of children: 1   Years of education: some college   Highest education level: Not on file  Occupational History   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   Employed full time. Walks 20 min twice/week.    Right-handed.   1-5 glasses of soft drinks per day.   Social Determinants of Health   Financial Resource Strain: Not on file  Food Insecurity: Not on  file  Transportation Needs: Not on file  Physical Activity: Not on file  Stress: Not on file  Social Connections: Not on file  Intimate Partner Violence: Not on file    Outpatient Medications Prior to Visit  Medication Sig Dispense Refill   albuterol (PROVENTIL) (2.5 MG/3ML) 0.083% nebulizer solution Take by nebulization as needed.      DULoxetine (CYMBALTA) 60 MG capsule Take 1 capsule (60 mg total) by mouth daily. Please call and schedule overdue appointment for further refills 30 capsule 0   fluticasone-salmeterol (ADVAIR DISKUS) 250-50 MCG/ACT AEPB Inhale 1 puff into the lungs in the morning and at bedtime. 1 each 2   montelukast (SINGULAIR) 10 MG tablet Take 1 tablet (10 mg total) by mouth at bedtime. 90 tablet 2   olmesartan (BENICAR) 40 MG tablet TAKE 1 TABLET BY MOUTH EVERY DAY 90 tablet 1   azithromycin (ZITHROMAX) 250 MG tablet 2 DAILY FOR FIRST DAY, THEN DECREASE TO ONE DAILY FOR 4 MORE DAYS. 6 tablet 0   No facility-administered medications prior to visit.    No Known Allergies  Review of Systems  Constitutional:  Positive for appetite change (decreased appetite), fatigue and fever.  HENT:  Positive for congestion. Negative for ear pain, sinus pressure and sore throat.   Eyes:  Negative for pain.  Respiratory:  Positive for cough, chest tightness, shortness of breath and wheezing.   Cardiovascular:  Negative for chest pain and palpitations.  Gastrointestinal:  Negative for abdominal pain, constipation, diarrhea, nausea and vomiting.  Endocrine: Negative.   Genitourinary:  Negative for dysuria and hematuria.  Musculoskeletal:  Positive for arthralgias and myalgias. Negative for back pain and joint swelling.  Skin:  Negative for rash.  Allergic/Immunologic: Positive for environmental allergies.  Neurological:  Negative for dizziness, weakness and headaches.  Hematological: Negative.   Psychiatric/Behavioral:  Negative for dysphoric mood. The patient is not  nervous/anxious.       Objective:    Physical Exam Vitals reviewed.  Constitutional:      Appearance: Normal appearance.  HENT:     Head: Normocephalic.     Right Ear: Tympanic membrane normal.     Left Ear: Tympanic membrane normal.     Nose: Nose normal.     Mouth/Throat:     Mouth: Mucous membranes are dry.  Cardiovascular:     Rate and Rhythm: Regular rhythm. Tachycardia present.     Pulses: Normal pulses.     Heart sounds: Normal heart sounds.  Pulmonary:     Comments: Tachypnea and decreased breath sound bilaterally Abdominal:     General: Bowel sounds are normal.     Palpations: Abdomen is soft.  Musculoskeletal:     Cervical back: Neck supple. No tenderness.  Lymphadenopathy:     Cervical: No cervical adenopathy.  Skin:    General: Skin is warm and dry.     Capillary Refill: Capillary refill takes less than 2 seconds.  Neurological:     General: No focal deficit present.     Mental Status: She is alert and oriented to person, place, and time.  Psychiatric:        Mood and Affect: Mood normal.        Behavior: Behavior normal.    BP 118/62 (BP Location: Right Arm, Patient Position: Sitting)   Pulse (!) 103   Temp 97.7 F (36.5 C) (Temporal)   Ht 5\' 2"  (1.575 m)   Wt 145 lb (65.8 kg)   SpO2 98%   BMI 26.52 kg/m  Wt Readings from Last 3 Encounters:  12/14/20 145 lb (65.8 kg)  09/08/20 148 lb (67.1 kg)  08/25/20 146 lb (66.2 kg)    Health Maintenance Due  Topic Date Due   Hepatitis C Screening  Never done   TETANUS/TDAP  Never done   PAP SMEAR-Modifier  Never done   COLONOSCOPY (Pts 45-67yrs Insurance coverage will need to be confirmed)  Never done   COVID-19 Vaccine (3 - Booster for Pfizer series) 03/09/2020     Lab Results  Component Value Date   TSH 1.060 10/28/2019   Lab Results  Component Value Date   WBC 10.0 10/16/2019   HGB 14.6 10/16/2019   HCT 41.1 10/16/2019   MCV 83 10/16/2019   PLT 349 10/16/2019   Lab Results  Component  Value Date   NA 142 10/16/2019   K 4.4 10/16/2019   CO2 23 10/16/2019   GLUCOSE 92 10/16/2019   BUN 9 10/16/2019   CREATININE 0.64 10/16/2019   BILITOT 0.2 10/16/2019   ALKPHOS 73 10/16/2019   AST 18 10/16/2019   ALT 26 10/16/2019   PROT 7.4 10/16/2019   ALBUMIN 4.5 10/16/2019   CALCIUM 9.4  10/16/2019         Assessment & Plan:   1. Moderate persistent asthma with acute exacerbation - DG Chest 2 View - amoxicillin-clavulanate (AUGMENTIN) 875-125 MG tablet; Take 1 tablet by mouth 2 (two) times daily.  Dispense: 20 tablet; Refill: 0 -continue Singulair -continue Advair and Albuterol inhalers  2. Fever in adult - DG Chest 2 View - CBC with Differential/Platelet - Comprehensive metabolic panel - TSH -rest and push fluids -Tylenol and Ibuprofen as directed  3. Sore throat - POCT Rapid Strep A - POC COVID-19 BinaxNow - DG Chest 2 View  4. Cough - DG Chest 2 View - promethazine-dextromethorphan (PROMETHAZINE-DM) 6.25-15 MG/5ML syrup; Take 5 mLs by mouth 4 (four) times daily as needed for cough.  Dispense: 118 mL; Refill: 0   Obtain chest x-ray at [facility] today We will call you with lab and x-ray results Begin Augmentin twice daily for 10 days Use inhalers as directed, rinse mouth afterwards Do not drive,use power machinery, or drink alcohol when taking prescription  cough medication Seek emergency medical care for worsening shortness of breath Follow-up as needed    I,Lauren M Auman,acting as a scribe for [employer], NP.,have documented all relevant documentation on the behalf of Janie Morning, NP,as directed by  Janie Morning, NP while in the presence of Janie Morning, NP.    I, Janie Morning, NP, have reviewed all documentation for this visit. The documentation on 12/14/20 for the exam, diagnosis, procedures, and orders are all accurate and complete.    Follow-up: As needed  Signed,  Flonnie Hailstone, DNP

## 2020-12-15 ENCOUNTER — Telehealth: Payer: Self-pay

## 2020-12-15 ENCOUNTER — Other Ambulatory Visit: Payer: Self-pay

## 2020-12-15 ENCOUNTER — Emergency Department (HOSPITAL_COMMUNITY): Payer: 59

## 2020-12-15 ENCOUNTER — Encounter (HOSPITAL_COMMUNITY): Payer: Self-pay | Admitting: Pharmacy Technician

## 2020-12-15 ENCOUNTER — Emergency Department (HOSPITAL_COMMUNITY)
Admission: EM | Admit: 2020-12-15 | Discharge: 2020-12-16 | Disposition: A | Discharge status: Home / Self Care | Payer: 59 | Attending: Emergency Medicine | Admitting: Emergency Medicine

## 2020-12-15 DIAGNOSIS — R0600 Dyspnea, unspecified: Secondary | ICD-10-CM

## 2020-12-15 DIAGNOSIS — Z20822 Contact with and (suspected) exposure to covid-19: Secondary | ICD-10-CM | POA: Diagnosis not present

## 2020-12-15 DIAGNOSIS — R0789 Other chest pain: Secondary | ICD-10-CM | POA: Insufficient documentation

## 2020-12-15 DIAGNOSIS — J45909 Unspecified asthma, uncomplicated: Secondary | ICD-10-CM | POA: Insufficient documentation

## 2020-12-15 DIAGNOSIS — Z8616 Personal history of COVID-19: Secondary | ICD-10-CM | POA: Insufficient documentation

## 2020-12-15 DIAGNOSIS — R0602 Shortness of breath: Secondary | ICD-10-CM | POA: Insufficient documentation

## 2020-12-15 DIAGNOSIS — Z7952 Long term (current) use of systemic steroids: Secondary | ICD-10-CM | POA: Diagnosis not present

## 2020-12-15 DIAGNOSIS — I10 Essential (primary) hypertension: Secondary | ICD-10-CM | POA: Diagnosis not present

## 2020-12-15 LAB — CBC WITH DIFFERENTIAL/PLATELET
Abs Immature Granulocytes: 0.08 10*3/uL — ABNORMAL HIGH (ref 0.00–0.07)
Basophils Absolute: 0.1 10*3/uL (ref 0.0–0.2)
Basophils Absolute: 0.1 10*3/uL (ref 0.0–0.1)
Basophils Relative: 1 %
Basos: 1 %
EOS (ABSOLUTE): 0.2 10*3/uL (ref 0.0–0.4)
Eos: 1 %
Eosinophils Absolute: 0.2 10*3/uL (ref 0.0–0.5)
Eosinophils Relative: 2 %
HCT: 39.1 % (ref 36.0–46.0)
Hematocrit: 42.1 % (ref 34.0–46.6)
Hemoglobin: 14.4 g/dL (ref 11.1–15.9)
Hemoglobin: 13.7 g/dL (ref 12.0–15.0)
Immature Grans (Abs): 0.1 10*3/uL (ref 0.0–0.1)
Immature Granulocytes: 1 %
Immature Granulocytes: 1 %
Lymphocytes Absolute: 2.8 10*3/uL (ref 0.7–3.1)
Lymphocytes Relative: 15 %
Lymphs Abs: 2 10*3/uL (ref 0.7–4.0)
Lymphs: 18 %
MCH: 28.1 pg (ref 26.6–33.0)
MCH: 28.4 pg (ref 26.0–34.0)
MCHC: 34.2 g/dL (ref 31.5–35.7)
MCHC: 35 g/dL (ref 30.0–36.0)
MCV: 82 fL (ref 79–97)
MCV: 81.1 fL (ref 80.0–100.0)
Monocytes Absolute: 1.7 10*3/uL — ABNORMAL HIGH (ref 0.1–0.9)
Monocytes Absolute: 1.4 10*3/uL — ABNORMAL HIGH (ref 0.1–1.0)
Monocytes Relative: 10 %
Monocytes: 10 %
Neutro Abs: 10 10*3/uL — ABNORMAL HIGH (ref 1.7–7.7)
Neutrophils Absolute: 11.3 10*3/uL — ABNORMAL HIGH (ref 1.4–7.0)
Neutrophils Relative %: 71 %
Neutrophils: 69 %
Platelets: 385 10*3/uL (ref 150–450)
Platelets: 343 10*3/uL (ref 150–400)
RBC: 5.13 x10E6/uL (ref 3.77–5.28)
RBC: 4.82 MIL/uL (ref 3.87–5.11)
RDW: 12.7 % (ref 11.7–15.4)
RDW: 13.3 % (ref 11.5–15.5)
WBC: 16.2 10*3/uL — ABNORMAL HIGH (ref 3.4–10.8)
WBC: 13.8 10*3/uL — ABNORMAL HIGH (ref 4.0–10.5)
nRBC: 0 % (ref 0.0–0.2)

## 2020-12-15 LAB — I-STAT BETA HCG BLOOD, ED (MC, WL, AP ONLY): I-stat hCG, quantitative: <5 m[IU]/mL (ref <5)

## 2020-12-15 LAB — COMPREHENSIVE METABOLIC PANEL
ALT: 16 IU/L (ref 0–32)
AST: 11 IU/L (ref 0–40)
Albumin/Globulin Ratio: 1.4 (ref 1.2–2.2)
Albumin: 4.2 g/dL (ref 3.8–4.8)
Alkaline Phosphatase: 88 IU/L (ref 44–121)
BUN/Creatinine Ratio: 13 (ref 9–23)
BUN: 10 mg/dL (ref 6–24)
Bilirubin Total: 0.4 mg/dL (ref 0.0–1.2)
CO2: 22 mmol/L (ref 20–29)
Calcium: 9.2 mg/dL (ref 8.7–10.2)
Chloride: 96 mmol/L (ref 96–106)
Creatinine, Ser: 0.8 mg/dL (ref 0.57–1.00)
Globulin, Total: 2.9 g/dL (ref 1.5–4.5)
Glucose: 87 mg/dL (ref 65–99)
Potassium: 4.5 mmol/L (ref 3.5–5.2)
Sodium: 136 mmol/L (ref 134–144)
Total Protein: 7.1 g/dL (ref 6.0–8.5)
eGFR: 90 mL/min/{1.73_m2} (ref >59)

## 2020-12-15 LAB — RESP PANEL BY RT-PCR (FLU A&B, COVID) ARPGX2
Influenza A by PCR: NEGATIVE
Influenza B by PCR: NEGATIVE
SARS Coronavirus 2 by RT PCR: NEGATIVE

## 2020-12-15 LAB — BASIC METABOLIC PANEL
Anion gap: 10 (ref 5–15)
BUN: 15 mg/dL (ref 6–20)
CO2: 25 mmol/L (ref 22–32)
Calcium: 8.7 mg/dL — ABNORMAL LOW (ref 8.9–10.3)
Chloride: 100 mmol/L (ref 98–111)
Creatinine, Ser: 1.01 mg/dL — ABNORMAL HIGH (ref 0.44–1.00)
GFR, Estimated: >60 mL/min (ref >60)
Glucose, Bld: 115 mg/dL — ABNORMAL HIGH (ref 70–99)
Potassium: 4 mmol/L (ref 3.5–5.1)
Sodium: 135 mmol/L (ref 135–145)

## 2020-12-15 LAB — TSH: TSH: 0.756 u[IU]/mL (ref 0.450–4.500)

## 2020-12-15 LAB — TROPONIN I (HIGH SENSITIVITY)
Troponin I (High Sensitivity): 5 ng/L (ref <18)
Troponin I (High Sensitivity): 4 ng/L (ref <18)

## 2020-12-15 MED ORDER — ACETAMINOPHEN 325 MG PO TABS
650.0000 mg | ORAL_TABLET | Freq: Once | ORAL | Status: AC
  Administered 2020-12-16: 650 mg via ORAL
  Filled 2020-12-15: qty 2

## 2020-12-15 MED ORDER — IPRATROPIUM-ALBUTEROL 0.5-2.5 (3) MG/3ML IN SOLN
3.0000 mL | Freq: Once | RESPIRATORY_TRACT | Status: AC
  Administered 2020-12-16: 3 mL via RESPIRATORY_TRACT
  Filled 2020-12-15: qty 3

## 2020-12-15 NOTE — Telephone Encounter (Signed)
Patient mentioned that she has SOB and she cannot control her breathing. She feels like she will pass out to walk to the bathroom . I told Dr Marina Goodell and he recommended to go to Emergency Department. Patient verbalized to understood and she will go to [facility].

## 2020-12-15 NOTE — ED Provider Notes (Signed)
Emergency Medicine Provider Triage Evaluation Note  Alison Simpson , a 50 y.o. female  was evaluated in triage.  Pt complains of worsening shortness of breath x2 weeks. History of asthma. She was seen by PCP yesterday which shortness of breath was thought to be related to asthma exacerbation. She has been on prednisone, azithromycin, and Augmentin with no relief. Admits to intermittent fever. States she has been tested for COVID a few times which were negative. No previous intubations or admissions for asthma. Admits to chest pain only when coughing. She has been using her albuterol every 4 hours with no relief. No history of blood clots. She is not on any hormonal treatments.   Review of Systems  Positive: SOB   Physical Exam  BP 104/60 (BP Location: Left Arm)   Pulse 95   Temp 99.7 F (37.6 C) (Oral)   Resp 18   SpO2 99%  Gen:   Awake, no distress   Resp:  Normal effort  MSK:   Moves extremities without difficulty  Other:    Medical Decision Making  Medically screening exam initiated at 6:42 PM.  Appropriate orders placed.  Alonah R Palmisano was informed that the remainder of the evaluation will be completed by another provider, this initial triage assessment does not replace that evaluation, and the importance of remaining in the ED until their evaluation is complete.  Labs ordered. Repeat COVID test. CXR to rule out pneumonia.    Mannie Stabile, PA-C 12/15/20 1846    Linwood Dibbles, MD 12/16/20 [phone]

## 2020-12-15 NOTE — ED Triage Notes (Signed)
Pt here with reports of shob for over a week. Hx asthma, using inhalers without relief. Pt endorses fevers at home. Recently on abx and prednisone.

## 2020-12-15 NOTE — ED Provider Notes (Signed)
[facility] EMERGENCY DEPARTMENT Provider Note   CSN: [id number] Arrival date & time: 12/15/20  1729     History Chief Complaint  Patient presents with   Shortness of Breath    Alison Simpson is a 50 y.o. female with a hx of asthma, hypertension, & prior tubal ligation who presents to the ED with complaints of dyspnea x 2 weeks. Patient feels persistently short of breath, worse with activity, no alleviating factors. Associated sxs include congestion, dry cough, wheezing, and chest/abdominal discomfort from all of her coughing. Was given a 6 day steroid taper by PCP- finished this 12/11/20 as well as azithromycin- no improvement, seen again by PCP and started on augmentin yesterday, no additional steroids. Nebs/inhaler not helping at home. Started to notice low grade temps at home. Denies leg pain/swelling, hemoptysis, recent surgery/trauma, recent long travel, hormone use, N/V/D, dysuria, personal hx of cancer, or hx of DVT/PE.     HPI     Past Medical History:  Diagnosis Date   Arm pain    Asthma    Hypertension    Pregnancy induced hypertension    Tingling     Patient Active Problem List   Diagnosis Date Noted   Adhesive capsulitis of right shoulder 08/25/2020   Routine general medical examination at a health care facility 07/21/2020   Moderate asthma 07/21/2020   Cervical adenopathy 07/21/2020   COVID-19 07/05/2020   Biceps tendonitis, right 04/29/2020   Paresthesia 10/28/2019   Neck pain 10/28/2019   RUQ abdominal pain 10/16/2019   Brachial plexitis 09/30/2019   DYSPNEA 01/15/2009   CHEST PAIN 01/15/2009    Past Surgical History:  Procedure Laterality Date   TUBAL LIGATION     1999     OB History   No obstetric history on file.     Family History  Problem Relation Age of Onset   Cataracts Mother    Other Father        unsure of medical history   Diabetes Neg Hx    Hypertension Neg Hx    Coronary artery disease Neg Hx     Social  History   Tobacco Use   Smoking status: Never   Smokeless tobacco: Never  Substance Use Topics   Alcohol use: No   Drug use: No    Home Medications Prior to Admission medications   Medication Sig Start Date End Date Taking? Authorizing Provider  albuterol (PROVENTIL) (2.5 MG/3ML) 0.083% nebulizer solution Take by nebulization as needed.  04/04/19   [provider]  amoxicillin-clavulanate (AUGMENTIN) 875-125 MG tablet Take 1 tablet by mouth 2 (two) times daily. 12/14/20   Janie Morning, NP  DULoxetine (CYMBALTA) 60 MG capsule Take 1 capsule (60 mg total) by mouth daily. Please call and schedule overdue appointment for further refills 12/07/20   Levert Feinstein, MD  fluticasone-salmeterol (ADVAIR DISKUS) 250-50 MCG/ACT AEPB Inhale 1 puff into the lungs in the morning and at bedtime. 12/07/20   Cox, Fritzi Mandes, MD  montelukast (SINGULAIR) 10 MG tablet Take 1 tablet (10 mg total) by mouth at bedtime. 05/17/20   Abigail Miyamoto, MD  olmesartan (BENICAR) 40 MG tablet TAKE 1 TABLET BY MOUTH EVERY DAY 12/12/20   Abigail Miyamoto, MD  promethazine-dextromethorphan (PROMETHAZINE-DM) 6.25-15 MG/5ML syrup Take 5 mLs by mouth 4 (four) times daily as needed for cough. 12/14/20   Janie Morning, NP    Allergies    Patient has no known allergies.  Review of Systems  Review of Systems  Constitutional:  Positive for fever.  HENT:  Positive for congestion and sore throat.   Respiratory:  Positive for cough, shortness of breath and wheezing.   Cardiovascular:  Positive for chest pain. Negative for leg swelling.  Gastrointestinal:  Positive for abdominal pain. Negative for blood in stool, diarrhea, nausea and vomiting.  Genitourinary:  Negative for dysuria.  Neurological:  Negative for syncope.  All other systems reviewed and are negative.  Physical Exam Updated Vital Signs BP (!) 111/55 (BP Location: Left Arm)   Pulse (!) 103   Temp (!) 100.9 F (38.3 C) (Oral)   Resp 18   SpO2  100%   Physical Exam Vitals and nursing note reviewed.  Constitutional:      General: She is not in acute distress.    Appearance: She is well-developed. She is not toxic-appearing.  HENT:     Head: Normocephalic and atraumatic.     Nose: Congestion present.     Mouth/Throat:     Pharynx: Oropharynx is clear. Uvula midline.     Comments: Posterior oropharynx is symmetric appearing. Patient tolerating own secretions without difficulty. No trismus. No drooling. No hot potato voice. No swelling beneath the tongue, submandibular compartment is soft.   Eyes:     General:        Right eye: No discharge.        Left eye: No discharge.     Conjunctiva/sclera: Conjunctivae normal.  Cardiovascular:     Rate and Rhythm: Normal rate and regular rhythm.  Pulmonary:     Effort: Pulmonary effort is normal. No respiratory distress.     Breath sounds: Normal breath sounds. No wheezing, rhonchi or rales.  Chest:     Chest wall: Tenderness (lower anterior bilateral chest wall) present.  Abdominal:     General: There is no distension.     Palpations: Abdomen is soft.     Tenderness: There is no abdominal tenderness. There is no guarding or rebound.  Musculoskeletal:     Cervical back: Neck supple.     Right lower leg: No tenderness. No edema.     Left lower leg: No tenderness. No edema.  Skin:    General: Skin is warm and dry.     Findings: No rash.  Neurological:     Mental Status: She is alert.     Comments: Clear speech.   Psychiatric:        Behavior: Behavior normal.    ED Results / Procedures / Treatments   Labs (all labs ordered are listed, but only abnormal results are displayed) Labs Reviewed  CBC WITH DIFFERENTIAL/PLATELET - Abnormal; Notable for the following components:      Result Value   WBC 13.8 (*)    Neutro Abs 10.0 (*)    Monocytes Absolute 1.4 (*)    Abs Immature Granulocytes 0.08 (*)    All other components within normal limits  BASIC METABOLIC PANEL - Abnormal;  Notable for the following components:   Glucose, Bld 115 (*)    Creatinine, Ser 1.01 (*)    Calcium 8.7 (*)    All other components within normal limits  RESP PANEL BY RT-PCR (FLU A&B, COVID) ARPGX2  I-STAT BETA HCG BLOOD, ED ([facility], [facility], [facility] ONLY)  TROPONIN I (HIGH SENSITIVITY)  TROPONIN I (HIGH SENSITIVITY)    EKG None  Radiology DG Chest 2 View  Result Date: 12/15/2020 CLINICAL DATA:  Shortness of breath EXAM: CHEST - 2 VIEW COMPARISON:  10/21/2019  FINDINGS: The heart size and mediastinal contours are within normal limits. Both lungs are clear. The visualized skeletal structures are unremarkable. IMPRESSION: No active cardiopulmonary disease. Electronically Signed   By: Deatra RobinsonKevin  Herman M.D.   On: 12/15/2020 21:18   CT Angio Chest PE W/Cm &/Or Wo Cm  Result Date: 12/16/2020 CLINICAL DATA:  Shortness of breath, history of asthma, fevers EXAM: CT ANGIOGRAPHY CHEST WITH CONTRAST TECHNIQUE: Multidetector CT imaging of the chest was performed using the standard protocol during bolus administration of intravenous contrast. Multiplanar CT image reconstructions and MIPs were obtained to evaluate the vascular anatomy. CONTRAST:  53mL OMNIPAQUE IOHEXOL 350 MG/ML SOLN COMPARISON:  CT 01/15/2009 FINDINGS: Cardiovascular: Satisfactory opacification the pulmonary arteries to the segmental level. No pulmonary artery filling defects are identified. Central pulmonary arteries are normal caliber. Normal heart size. No pericardial effusion. The aortic root is suboptimally assessed given cardiac pulsation artifact. Atherosclerotic plaque within the normal caliber aorta. No visible acute luminal abnormality of the imaged aorta on this non tailored exam. No periaortic stranding or hemorrhage. Shared origin of the brachiocephalic and left common carotid arteries. Minimal plaque in the proximal great vessels without acute abnormality. Mediastinum/Nodes: No mediastinal fluid or gas. Normal thyroid gland and thoracic inlet.  No acute abnormality of the trachea. Small hiatal hernia with some mild thickening of the distal thoracic esophagus. No worrisome mediastinal, hilar or axillary adenopathy. Lungs/Pleura: Dependent atelectasis, likely accentuated by imaging during exhalation. Diffuse mild airways thickening. Some patchy peribronchial ground-glass opacities present in the right middle lobe. Dependent atelectasis. No pneumothorax or visible effusion. No concerning pulmonary nodules or masses. Upper Abdomen: No acute abnormalities present in the visualized portions of the upper abdomen. Musculoskeletal: No acute osseous abnormality or suspicious osseous lesion. Review of the MIP images confirms the above findings. IMPRESSION: No evidence of pulmonary artery embolism. Diffuse mild airways thickening with some patchy ground-glass and centrilobular opacities in the right middle lobe. Findings consistent with acute on chronic airways inflammation/asthma exacerbation. Mild nonspecific thickening of the distal thoracic esophagus with small hiatal hernia. Correlate for clinical features of esophagitis and with outpatient direct visualization as clinically warranted. Electronically Signed   By: Kreg ShropshirePrice  DeHay M.D.   On: 12/16/2020 02:19    Procedures Procedures   Medications Ordered in ED Medications  acetaminophen (TYLENOL) tablet 650 mg (650 mg Oral Given 12/16/20 0029)  ipratropium-albuterol (DUONEB) 0.5-2.5 (3) MG/3ML nebulizer solution 3 mL (3 mLs Nebulization Given 12/16/20 0034)  ipratropium-albuterol (DUONEB) 0.5-2.5 (3) MG/3ML nebulizer solution 3 mL (3 mLs Nebulization Given 12/16/20 0150)  iohexol (OMNIPAQUE) 350 MG/ML injection 53 mL (53 mLs Intravenous Contrast Given 12/16/20 0208)  chlorpheniramine-HYDROcodone (TUSSIONEX) 10-8 MG/5ML suspension 5 mL (5 mLs Oral Given 12/16/20 0341)    ED Course  I have reviewed the triage vital signs and the nursing notes.  Pertinent labs & imaging results that were available during my  care of the patient were reviewed by me and considered in my medical decision making (see chart for details).  Alison Simpson was evaluated in Emergency Department on 12/16/2020 for the symptoms described in the history of present illness. He/she was evaluated in the context of the global COVID-19 pandemic, which necessitated consideration that the patient might be at risk for infection with the SARS-CoV-2 virus that causes COVID-19. Institutional protocols and algorithms that pertain to the evaluation of patients at risk for COVID-19 are in a state of rapid change based on information released by regulatory bodies including the CDC and federal and state organizations.  These policies and algorithms were followed during the patient's care in the ED.    MDM Rules/Calculators/A&P                         Patient presents to the ED with complaints of dyspnea. Nontoxic, mildly febrile, initial tachycardia normalized. Lungs are clear. Chest/upper abdominal discomfort reproducible with lower chest wall palpation.   Additional history obtained:  Additional history obtained from chart review & nursing note review.   Lab Tests:  I Ordered, reviewed, and interpreted labs, which included:  CBC: Mild leukocytosis.  BMP: mild elevation in creatinine compared to prior.  Pregnancy: negative.  Troponins: flat COVID/flu: Negative  Imaging Studies ordered:  I ordered imaging studies which included CXR, I independently reviewed, formal radiology impression shows:  No active cardiopulmonary disease.  ED Course:  01:40: RE-EVAL: Some relief with duoneb from a sxs standpoint per patient, lungs remain clear on my exam, given this helped patient will repeat. D-dimer elevated--> CTA ordered.   CTA: No evidence of pulmonary artery embolism. Diffuse mild airways thickening with some patchy ground-glass and centrilobular opacities in the right middle lobe. Findings consistent with acute on chronic airways  inflammation/asthma exacerbation. Mild nonspecific thickening of the distal thoracic esophagus with small hiatal hernia. Correlate for clinical features of esophagitis and with outpatient direct visualization as clinically warranted  Patient ambulatory SPO2 100% on RA, she reports dyspnea, but does not appear in respiratory distress, lungs remain clear. No focal infiltrate on CTA, with her fever and mild leukocytosis, she was prescribed augmentin, feel this is appropriate- continue. Will repeat steroids, PCP follow up, will also provide pulmonology information. I discussed results, treatment plan, need for follow-up, and return precautions with the patient. Provided opportunity for questions, patient confirmed understanding and is in agreement with plan.   Findings and plan of care discussed with supervising physician Dr. Wilkie Aye who is in agreement.   Portions of this note were generated with Scientist, clinical (histocompatibility and immunogenetics). Dictation errors may occur despite best attempts at proofreading.  Final Clinical Impression(s) / ED Diagnoses Final diagnoses:  Dyspnea, unspecified type    Rx / DC Orders ED Discharge Orders          Ordered    predniSONE (DELTASONE) 50 MG tablet  Daily with breakfast        12/16/20 0403    benzonatate (TESSALON) 100 MG capsule  3 times daily PRN        12/16/20 0403             Susana Gripp, Pleas Koch, PA-C 12/16/20 0406    Shon Baton, MD 12/16/20 [phone]

## 2020-12-16 ENCOUNTER — Emergency Department (HOSPITAL_COMMUNITY): Payer: 59

## 2020-12-16 LAB — D-DIMER, QUANTITATIVE: D-Dimer, Quant: 0.77 ug/mL-FEU — ABNORMAL HIGH (ref 0.00–0.50)

## 2020-12-16 MED ORDER — IPRATROPIUM-ALBUTEROL 0.5-2.5 (3) MG/3ML IN SOLN
3.0000 mL | Freq: Once | RESPIRATORY_TRACT | Status: AC
  Administered 2020-12-16: 3 mL via RESPIRATORY_TRACT
  Filled 2020-12-16: qty 3

## 2020-12-16 MED ORDER — PREDNISONE 50 MG PO TABS: 50.0000 mg | ORAL_TABLET | Freq: Every day | ORAL | 0 refills | Status: DC

## 2020-12-16 MED ORDER — IOHEXOL 350 MG/ML SOLN
53.0000 mL | Freq: Once | INTRAVENOUS | Status: AC | PRN
  Administered 2020-12-16: 53 mL via INTRAVENOUS

## 2020-12-16 MED ORDER — HYDROCOD POLST-CPM POLST ER 10-8 MG/5ML PO SUER
5.0000 mL | Freq: Once | ORAL | Status: AC
  Administered 2020-12-16: 5 mL via ORAL
  Filled 2020-12-16: qty 5

## 2020-12-16 MED ORDER — BENZONATATE 100 MG PO CAPS: 100.0000 mg | ORAL_CAPSULE | Freq: Three times a day (TID) | ORAL | 0 refills | Status: DC | PRN

## 2020-12-16 NOTE — ED Notes (Signed)
Patient transported to CT 

## 2020-12-16 NOTE — ED Notes (Signed)
Patient verbalized understanding of discharge instructions. Opportunity for questions and answers.  

## 2020-12-16 NOTE — Discharge Instructions (Addendum)
You were seen in the ER tonight for trouble breathing.  Your labs showed that your kidney function was mildly elevated and your CT scan showed that you had some inflammation of your esophagus- please discuss this with your primary care provider. If you start to have pain with swallowing/eating please discuss this primary care as well, you may start taking a medication such as nexium over the counter in the mean time if this happens.    Continue to use your inhaler at home.  Continue your Augmenitn prescribed.  We are re-starting steroids- please start this morning.  We are sending you home with tessalon to take as needed for coughing as well.   We have prescribed you new medication(s) today. Discuss the medications prescribed today with your pharmacist as they can have adverse effects and interactions with your other medicines including over the counter and prescribed medications. Seek medical evaluation if you start to experience new or abnormal symptoms after taking one of these medicines, seek care immediately if you start to experience difficulty breathing, feeling of your throat closing, facial swelling, or rash as these could be indications of a more serious allergic reaction  Please follow up with primary care within 3 days.  Return to the ER for new or worsening symptoms including but not limited to increased trouble breathing, passing out, coughing up blood, new or worsening pain, or any other concerns.

## 2020-12-16 NOTE — ED Notes (Signed)
Patient ambulated without assistance. CO wheezing afterwards "feels like I ran a marathon" O2 steady at 100%

## 2020-12-29 ENCOUNTER — Other Ambulatory Visit: Payer: Self-pay | Admitting: Neurology

## 2021-01-02 ENCOUNTER — Other Ambulatory Visit: Payer: Self-pay | Admitting: Neurology

## 2021-01-26 ENCOUNTER — Institutional Professional Consult (permissible substitution): Payer: 59 | Admitting: Pulmonary Disease

## 2021-02-26 ENCOUNTER — Other Ambulatory Visit: Payer: Self-pay | Admitting: Legal Medicine

## 2021-02-26 DIAGNOSIS — R0602 Shortness of breath: Secondary | ICD-10-CM

## 2021-08-01 IMAGING — CR DG CHEST 2V
2 series · 2 of 2 positions shown · non-contrast
Comparison: 10/21/2019

CLINICAL DATA: Shortness of breath

EXAM:
CHEST - 2 VIEW

[chest pa]
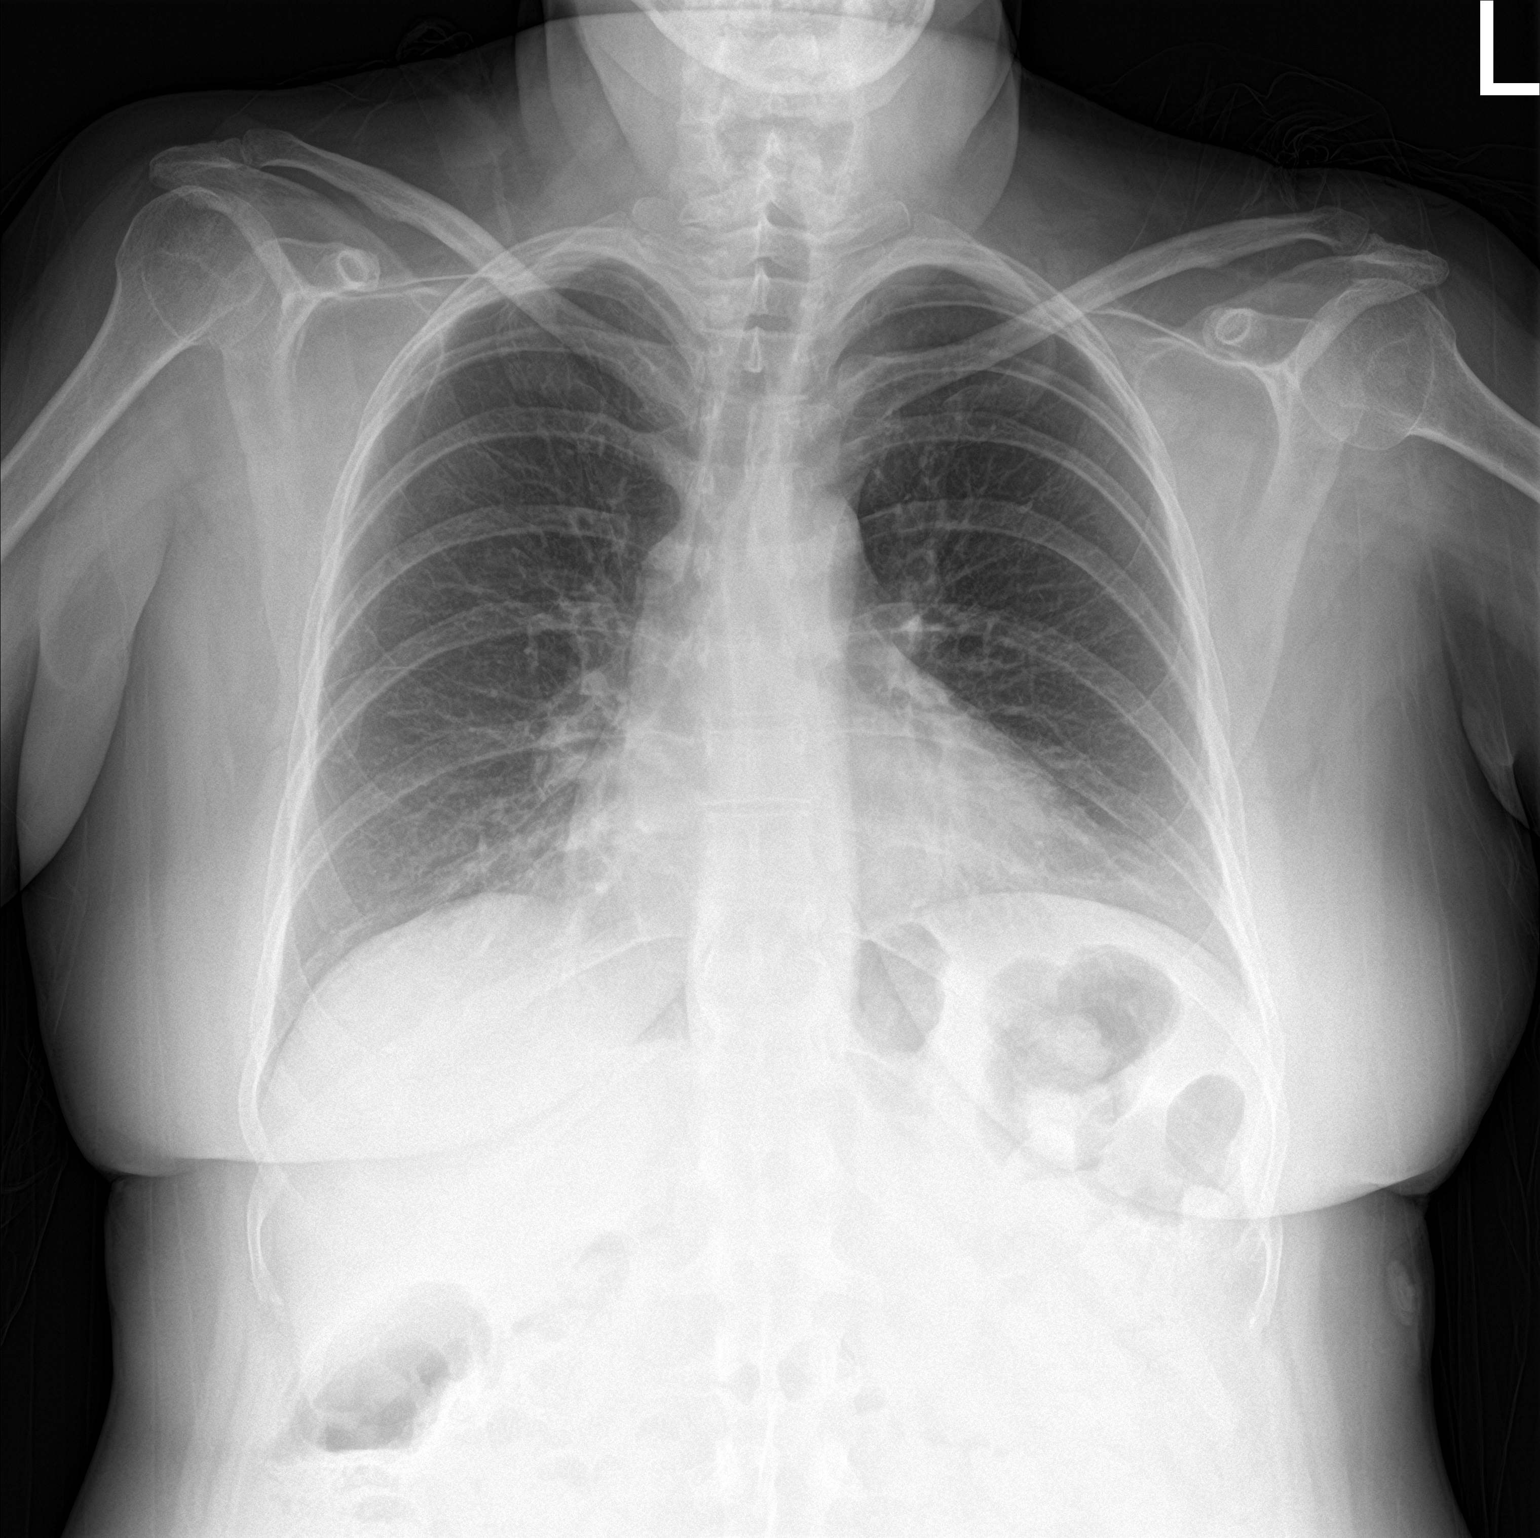

[chest lat]
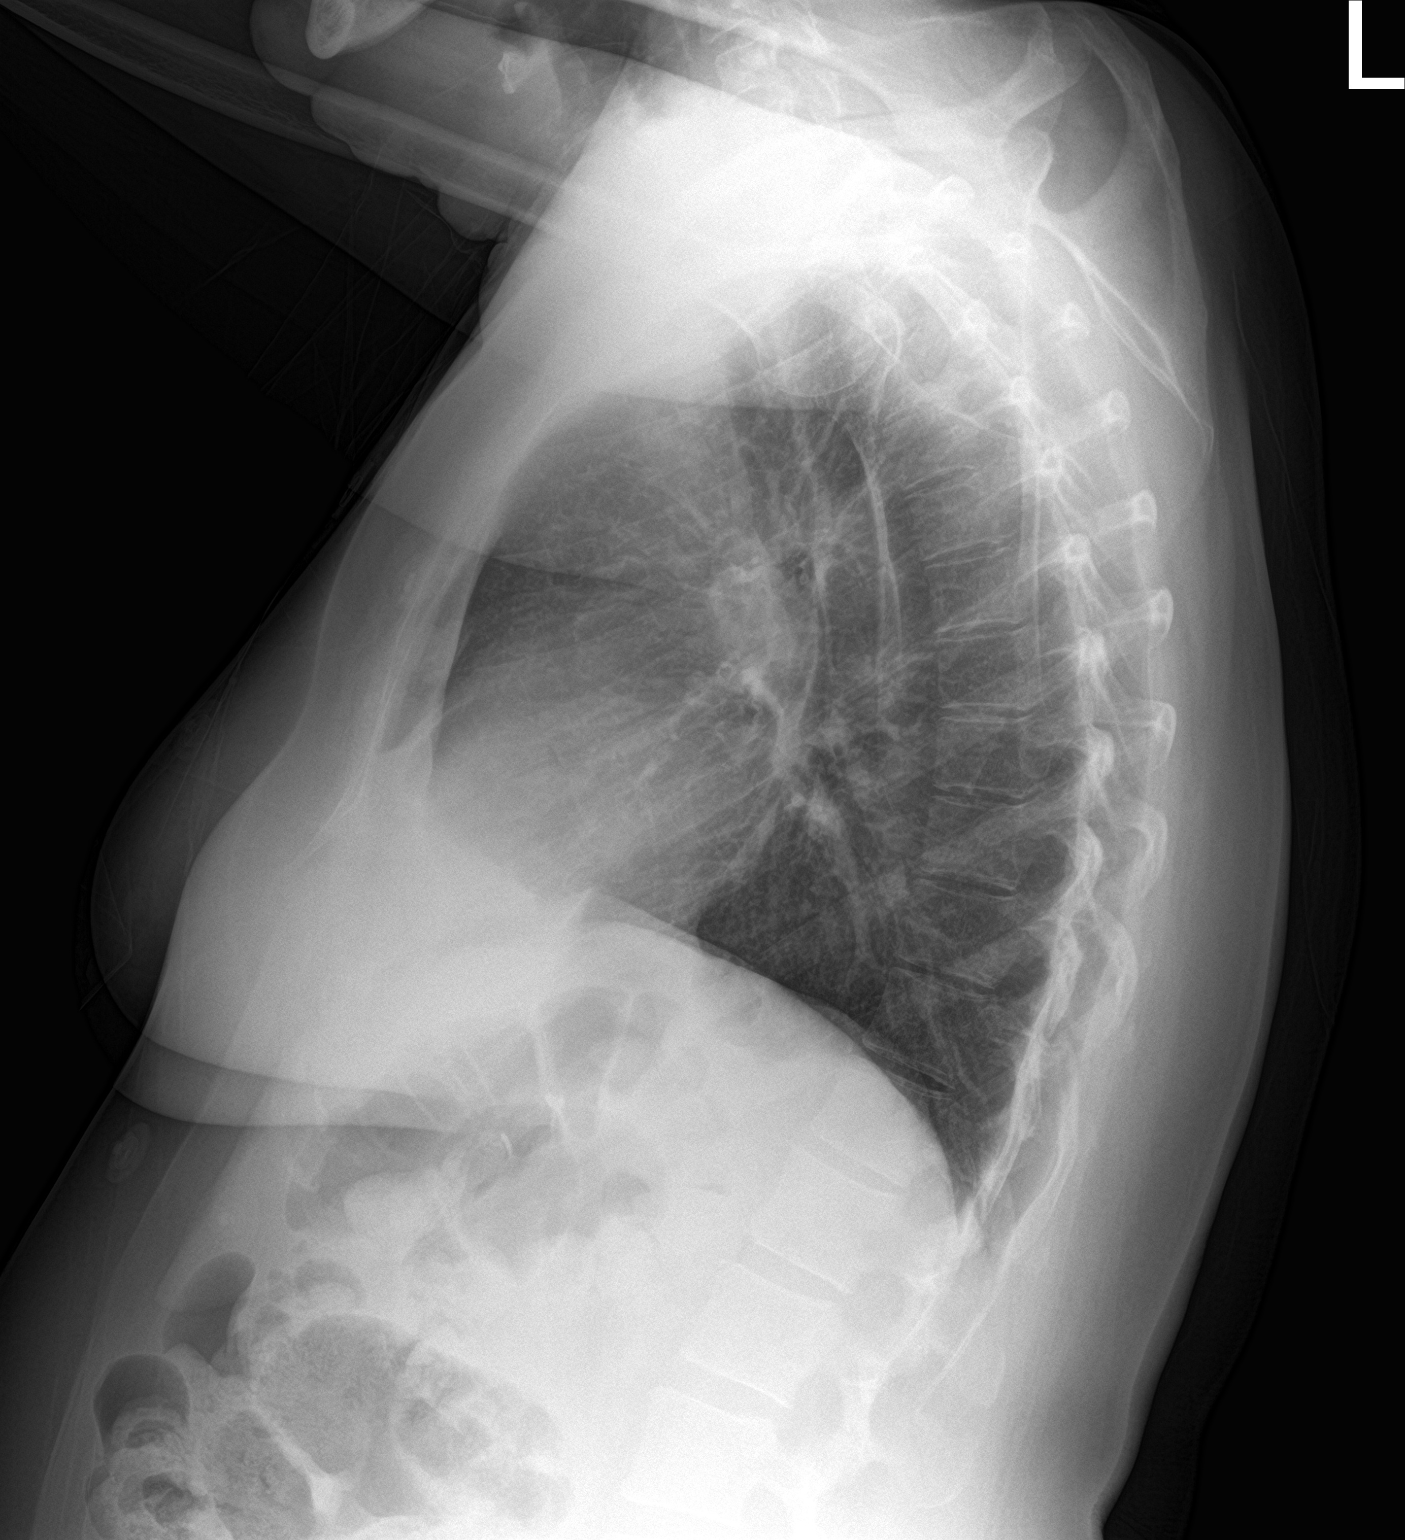

[2 of 2 positions shown; findings below may reference images not displayed]

FINDINGS: The heart size and mediastinal contours are within normal limits.
Both lungs are clear. The visualized skeletal structures are
unremarkable.
IMPRESSION: No active cardiopulmonary disease.

## 2021-10-18 ENCOUNTER — Telehealth: Payer: Self-pay

## 2021-10-18 NOTE — Telephone Encounter (Signed)
Patient called and stated that her blood pressure is dropping to 90s/60s in the evening. She takes Olmasartan daily in the morning. Patient is having fatigue, headaches, and diarrhea.  ? ?Patient refused appointment due to her new insurance not being in network. ?

## 2021-10-19 ENCOUNTER — Ambulatory Visit: Payer: BLUE CROSS/BLUE SHIELD | Admitting: Legal Medicine

## 2021-10-19 ENCOUNTER — Encounter: Payer: Self-pay | Admitting: Legal Medicine

## 2021-10-19 ENCOUNTER — Ambulatory Visit: Payer: 59 | Admitting: Legal Medicine

## 2021-10-19 VITALS — BP 130/80 | HR 84 | Temp 98.3°F | Resp 15 | Ht 62.0 in | Wt 143.0 lb

## 2021-10-19 DIAGNOSIS — M797 Fibromyalgia: Secondary | ICD-10-CM

## 2021-10-19 MED ORDER — DULOXETINE HCL 60 MG PO CPEP: 60.0000 mg | ORAL_CAPSULE | Freq: Every day | ORAL | 2 refills | Status: DC

## 2021-10-19 MED ORDER — ONDANSETRON HCL 4 MG PO TABS: 4.0000 mg | ORAL_TABLET | Freq: Three times a day (TID) | ORAL | 3 refills | Status: DC | PRN

## 2021-10-19 NOTE — Progress Notes (Signed)
? ?Subjective:  ?Patient ID: Alison Simpson, female    DOB: 21-Nov-1970  Age: 51 y.o. MRN: [id number] ? ?Chief Complaint  ?Patient presents with  ? Migraine  ? Hypertension  ? ? ?HPI: chronic visit ?patient has migraine for 2 weeks come and goes and also her blood pressure is fluctuate. She mentioned has blurred vision, nauseas and all her body is hurting. She thinks is fibromyalgia flare up. She does not take any medication. She ran out of cymbalta. ?She saw rheumatology in past.  She is aching all over. Increased fatigue. She is loquacious today. ?Current Outpatient Medications on File Prior to Visit  ?Medication Sig Dispense Refill  ? montelukast (SINGULAIR) 10 MG tablet TAKE 1 TABLET BY MOUTH EVERYDAY AT BEDTIME 90 tablet 1  ? olmesartan (BENICAR) 40 MG tablet TAKE 1 TABLET BY MOUTH EVERY DAY 90 tablet 1  ? albuterol (PROVENTIL) (2.5 MG/3ML) 0.083% nebulizer solution Take by nebulization as needed.  (Patient not taking: Reported on 10/19/2021)    ? ?No current facility-administered medications on file prior to visit.  ? ?Past Medical History:  ?Diagnosis Date  ? Arm pain   ? Asthma   ? Hypertension   ? Pregnancy induced hypertension   ? Tingling   ? ?Past Surgical History:  ?Procedure Laterality Date  ? TUBAL LIGATION    ? 1999  ?  ?Family History  ?Problem Relation Age of Onset  ? Cataracts Mother   ? Other Father   ?     unsure of medical history  ? Diabetes Neg Hx   ? Hypertension Neg Hx   ? Coronary artery disease Neg Hx   ? ?Social History  ? ?Socioeconomic History  ? Marital status: Married  ?  Spouse name: Not on file  ? Number of children: 1  ? Years of education: some college  ? Highest education level: Not on file  ?Occupational History  ? Occupation: works w/ Print production planner day program   ?  Employer: [employer]  ?Tobacco Use  ? Smoking status: Never  ? Smokeless tobacco: Never  ?Substance and Sexual Activity  ? Alcohol use: No  ? Drug use: No  ? Sexual activity: Yes  ?  Partners: Male  ?Other Topics  Concern  ? Not on file  ?Social History Narrative  ? Married, 1 biological child, 2 adopted children.  ? Employed full time. Walks 20 min twice/week.   ? Right-handed.  ? 1-5 glasses of soft drinks per day.  ? ?Social Determinants of Health  ? ?Financial Resource Strain: Not on file  ?Food Insecurity: Not on file  ?Transportation Needs: Not on file  ?Physical Activity: Not on file  ?Stress: Not on file  ?Social Connections: Not on file  ? ? ?Review of Systems  ?Constitutional:  Negative for chills, fatigue and fever.  ?HENT:  Negative for congestion, ear pain and sore throat.   ?Eyes:  Positive for visual disturbance.  ?Respiratory:  Negative for cough and shortness of breath.   ?Cardiovascular:  Negative for chest pain and palpitations.  ?Gastrointestinal:  Positive for nausea. Negative for abdominal pain, constipation, diarrhea and vomiting.  ?Endocrine: Negative for polydipsia, polyphagia and polyuria.  ?Genitourinary:  Negative for difficulty urinating and dysuria.  ?Musculoskeletal:  Negative for arthralgias, back pain and myalgias.  ?Skin:  Negative for rash.  ?Neurological:  Positive for headaches. Negative for light-headedness.  ?     She is having paresthesias and aching.  ?Psychiatric/Behavioral:  Negative for dysphoric mood. The  patient is not nervous/anxious.   ? ? ?Objective:  ?BP 130/80   Pulse 84   Temp 98.3 ?F (36.8 ?C)   Resp 15   Ht 5\' 2"  (1.575 m)   Wt 143 lb (64.9 kg)   SpO2 94%   BMI 26.16 kg/m?  ? ? ?  10/19/2021  ?  8:57 AM 12/16/2020  ?  4:16 AM 12/16/2020  ?  3:19 AM  ?BP/Weight  ?Systolic BP 130 104 113  ?Diastolic BP 80 66 59  ?Wt. (Lbs) 143    ?BMI 26.16 kg/m2    ? ? ?Physical Exam ?Vitals reviewed.  ?Constitutional:   ?   General: She is in acute distress.  ?   Appearance: Normal appearance.  ?HENT:  ?   Head: Normocephalic and atraumatic.  ?   Right Ear: Tympanic membrane normal.  ?   Left Ear: Tympanic membrane normal.  ?   Nose: Nose normal.  ?Eyes:  ?   Extraocular Movements:  Extraocular movements intact.  ?   Conjunctiva/sclera: Conjunctivae normal.  ?   Pupils: Pupils are equal, round, and reactive to light.  ?Cardiovascular:  ?   Rate and Rhythm: Normal rate and regular rhythm.  ?   Pulses: Normal pulses.  ?   Heart sounds: Normal heart sounds. No murmur heard. ?  No gallop.  ?Pulmonary:  ?   Effort: Pulmonary effort is normal. No respiratory distress.  ?   Breath sounds: Normal breath sounds. No wheezing.  ?Abdominal:  ?   General: Abdomen is flat. Bowel sounds are normal. There is no distension.  ?   Tenderness: There is no abdominal tenderness.  ?Musculoskeletal:     ?   General: Normal range of motion.  ?   Comments: Tender areas in shoulder girdle, pelvic girdle, arms especial right and legs.  ?Skin: ?   General: Skin is warm and dry.  ?   Capillary Refill: Capillary refill takes less than 2 seconds.  ?Neurological:  ?   General: No focal deficit present.  ?   Mental Status: She is alert. Mental status is at baseline.  ?   Sensory: Sensory deficit present.  ? ? ? ?  ? ?Lab Results  ?Component Value Date  ? WBC 13.8 (H) 12/15/2020  ? HGB 13.7 12/15/2020  ? HCT 39.1 12/15/2020  ? PLT 343 12/15/2020  ? GLUCOSE 115 (H) 12/15/2020  ? ALT 16 12/14/2020  ? AST 11 12/14/2020  ? NA 135 12/15/2020  ? K 4.0 12/15/2020  ? CL 100 12/15/2020  ? CREATININE 1.01 (H) 12/15/2020  ? BUN 15 12/15/2020  ? CO2 25 12/15/2020  ? TSH 0.756 12/14/2020  ? ? ? ? ?Assessment & Plan:  ? ?Diagnoses and all orders for this visit: ?Fibromyalgia ?-     DULoxetine (CYMBALTA) 60 MG capsule; Take 1 capsule (60 mg total) by mouth daily. Please call and schedule overdue appointment for further refills ?-     ondansetron (ZOFRAN) 4 MG tablet; Take 1 tablet (4 mg total) by mouth every 8 (eight) hours as needed for nausea or vomiting. ?Restart cymbalta, we discussed the mechanisms of fibromyalgia and pain as well as its natural history she seemed to understand it well. ? ? . ? ? ? ?  ? ?Follow-up: Return in about 3  months (around 01/18/2022) for hypertension. ? ?An After Visit Summary was printed and given to the patient. ? ?01/20/2022, MD ?[facility] ?[phone] ?

## 2021-10-19 NOTE — Patient Instructions (Signed)
Myofascial Pain Syndrome and Fibromyalgia ?Myofascial pain syndrome and fibromyalgia are both pain disorders. You may feel this pain mainly in your muscles. ?Myofascial pain syndrome: ?Always has tender points in the muscles that will cause pain when pressed (trigger points). The pain may come and go. ?Usually affects your neck, upper back, and shoulder areas. The pain often moves into your arms and hands. ?Fibromyalgia: ?Has muscle pains and tenderness that come and go. ?Is often associated with tiredness (fatigue) and sleep problems. ?Has trigger points. ?Tends to be long-lasting (chronic), but is not life-threatening. ?Fibromyalgia and myofascial pain syndrome are not the same. However, they often occur together. If you have both conditions, each can make the other worse. Both are common and can cause enough pain and fatigue to make day-to-day activities difficult. Both can be hard to diagnose because their symptoms are common in many other conditions. ?What are the causes? ?The exact causes of these conditions are not known. ?What increases the risk? ?You are more likely to develop either of these conditions if: ?You have a family history of the condition. ?You are female. ?You have certain triggers, such as: ?Spine disorders. ?An injury (trauma) or other physical stressors. ?Being under a lot of stress. ?Medical conditions such as osteoarthritis, rheumatoid arthritis, or lupus. ?What are the signs or symptoms? ?Fibromyalgia ?The main symptom of fibromyalgia is widespread pain and tenderness in your muscles. Pain is sometimes described as stabbing, shooting, or burning. ?You may also have: ?Tingling or numbness. ?Sleep problems and fatigue. ?Problems with attention and concentration (fibro fog). ?Other symptoms may include: ?Bowel and bladder problems. ?Headaches. ?Vision problems. ?Sensitivity to odors and noises. ?Depression or mood changes. ?Painful menstrual periods (dysmenorrhea). ?Dry skin or eyes. ?These  symptoms can vary over time. ?Myofascial pain syndrome ?Symptoms of myofascial pain syndrome include: ?Tight, ropy bands of muscle. ?Uncomfortable sensations in muscle areas. These may include aching, cramping, burning, numbness, tingling, and weakness. ?Difficulty moving certain parts of the body freely (poor range of motion). ?How is this diagnosed? ?This condition may be diagnosed by your symptoms and medical history. You will also have a physical exam. In general: ?Fibromyalgia is diagnosed if you have pain, fatigue, and other symptoms for more than 3 months, and symptoms cannot be explained by another condition. ?Myofascial pain syndrome is diagnosed if you have trigger points in your muscles, and those trigger points are tender and cause pain elsewhere in your body (referred pain). ?How is this treated? ?Treatment for these conditions depends on the type that you have. ?For fibromyalgia a healthy lifestyle is the most important treatment including aerobic and strength exercises. Different types of medicines are used to help treat pain and include: ?NSAIDs. ?Medicines for treating depression. ?Medicines that help control seizures. ?Medicines that relax the muscles. ?Treatment for myofascial pain syndrome includes: ?Pain medicines, such as NSAIDs. ?Cooling and stretching of muscles. ?Massage therapy with myofascial release technique. ?Trigger point injections. ?Treating these conditions often requires a team of health care providers. These may include: ?Your primary care provider. ?A physical therapist. ?Complementary health care providers, such as massage therapists or acupuncturists. ?A psychiatrist for cognitive behavioral therapy. ?Follow these instructions at home: ?Medicines ?Take over-the-counter and prescription medicines only as told by your health care provider. ?Ask your health care provider if the medicine prescribed to you: ?Requires you to avoid driving or using machinery. ?Can cause constipation.  You may need to take these actions to prevent or treat constipation: ?Drink enough fluid to keep your urine pale   yellow. ?Take over-the-counter or prescription medicines. ?Eat foods that are high in fiber, such as beans, whole grains, and fresh fruits and vegetables. ?Limit foods that are high in fat and processed sugars, such as fried or sweet foods. ?Lifestyle ? ?Do exercises as told by your health care provider or physical therapist. ?Practice relaxation techniques to control your stress. You may want to try: ?Biofeedback. ?Visual imagery. ?Hypnosis. ?Muscle relaxation. ?Yoga. ?Meditation. ?Maintain a healthy lifestyle. This includes eating a healthy diet and getting enough sleep. ?Do not use any products that contain nicotine or tobacco. These products include cigarettes, chewing tobacco, and vaping devices, such as e-cigarettes. If you need help quitting, ask your health care provider. ?General instructions ?Talk to your health care provider about complementary treatments, such as acupuncture or massage. ?Do not do activities that stress or strain your muscles. This includes repetitive motions and heavy lifting. ?Keep all follow-up visits. This is important. ?Where to find support ?Consider joining a support group with others who are diagnosed with this condition. ?National Fibromyalgia Association: www.fmaware.org ?Where to find more information ?American Chronic Pain Association: www.theacpa.org ?Contact a health care provider if: ?You have new symptoms. ?Your symptoms get worse or your pain is severe. ?You have side effects from your medicines. ?You have trouble sleeping. ?Your condition is causing depression or anxiety. ?Get help right away if: ?You have thoughts of hurting yourself or others. ?Get help right awayif you feel like you may hurt yourself or others, or have thoughts about taking your own life. Go to your nearest emergency room or: ?Call 911. ?Call the National Suicide Prevention Lifeline at  1-800-273-8255 or 988. This is open 24 hours a day. ?Text the Crisis Text Line at 741741. ?Summary ?Myofascial pain syndrome and fibromyalgia are pain disorders. ?Myofascial pain syndrome has tender points in the muscles that will cause pain when pressed (trigger points). Fibromyalgia also has muscle pains and tenderness that come and go, but this condition is often associated with fatigue and sleep disturbances. ?Fibromyalgia and myofascial pain syndrome are not the same but often occur together, causing pain and fatigue that make day-to-day activities difficult. ?Follow your health care provider's instructions for taking medicines and maintaining a healthy lifestyle. ?This information is not intended to replace advice given to you by your health care provider. Make sure you discuss any questions you have with your health care provider. ?Document Revised: 05/20/2021 Document Reviewed: 05/20/2021 ?Elsevier Patient Education ? 2023 Elsevier Inc. ? ?

## 2021-11-08 ENCOUNTER — Telehealth: Payer: Self-pay

## 2021-11-08 ENCOUNTER — Other Ambulatory Visit: Payer: Self-pay | Admitting: Legal Medicine

## 2021-11-08 DIAGNOSIS — J45909 Unspecified asthma, uncomplicated: Secondary | ICD-10-CM

## 2021-11-08 MED ORDER — BUDESONIDE-FORMOTEROL FUMARATE 80-4.5 MCG/ACT IN AERO: 2.0000 | INHALATION_SPRAY | Freq: Two times a day (BID) | RESPIRATORY_TRACT | 12 refills | Status: DC

## 2021-11-08 NOTE — Telephone Encounter (Signed)
Patient called stating that her insurance does not cover the advair. They will cover the following: ? ?Albuterol sulfate 108/90 base ? ?Symbicort 80/4.5 mcg  ?

## 2021-11-08 NOTE — Telephone Encounter (Signed)
I sent in symbicort ?lp ?

## 2021-11-08 NOTE — Telephone Encounter (Signed)
Patient was informed.

## 2021-11-17 NOTE — Progress Notes (Signed)
Cancelled.  

## 2021-11-18 ENCOUNTER — Ambulatory Visit (INDEPENDENT_AMBULATORY_CARE_PROVIDER_SITE_OTHER): Payer: BLUE CROSS/BLUE SHIELD | Admitting: Nurse Practitioner

## 2021-11-18 ENCOUNTER — Telehealth: Payer: Self-pay

## 2021-11-18 ENCOUNTER — Other Ambulatory Visit: Payer: Self-pay

## 2021-11-18 DIAGNOSIS — R590 Localized enlarged lymph nodes: Secondary | ICD-10-CM

## 2021-11-18 MED ORDER — ALBUTEROL SULFATE (2.5 MG/3ML) 0.083% IN NEBU: 2.5000 mg | INHALATION_SOLUTION | Freq: Four times a day (QID) | RESPIRATORY_TRACT | 0 refills | Status: AC | PRN

## 2021-11-18 NOTE — Telephone Encounter (Signed)
Patient called stating that she had an appointment this morning and had to cancel because we are out of network with her insurance.  She has called other offices and can't be seen for a few weeks.  She is asking if we can refill her Albuterol Nebulizer Solution -

## 2021-11-21 ENCOUNTER — Other Ambulatory Visit: Payer: Self-pay

## 2021-12-05 ENCOUNTER — Other Ambulatory Visit: Payer: Self-pay | Admitting: Legal Medicine

## 2022-01-24 ENCOUNTER — Encounter: Payer: Self-pay | Admitting: Legal Medicine

## 2022-01-25 ENCOUNTER — Encounter: Payer: BLUE CROSS/BLUE SHIELD | Admitting: Legal Medicine

## 2022-01-25 DIAGNOSIS — J4540 Moderate persistent asthma, uncomplicated: Secondary | ICD-10-CM

## 2022-01-25 NOTE — Progress Notes (Deleted)
Subjective:  Patient ID: Alison Simpson, female    DOB: 1971/03/02  Age: 51 y.o. MRN: [id number]  Chief Complaint  Patient presents with   Hypertension    HPI  Patient is here to follow up  in medication for blood pressure. She is taking Olmesartan 40 mg daily. Current Outpatient Medications on File Prior to Visit  Medication Sig Dispense Refill   albuterol (PROVENTIL) (2.5 MG/3ML) 0.083% nebulizer solution Take 3 mLs (2.5 mg total) by nebulization every 6 (six) hours as needed. 75 mL 0   budesonide-formoterol (SYMBICORT) 80-4.5 MCG/ACT inhaler Inhale 2 puffs into the lungs 2 (two) times daily. 1 each 12   DULoxetine (CYMBALTA) 60 MG capsule Take 1 capsule (60 mg total) by mouth daily. Please call and schedule overdue appointment for further refills 90 capsule 2   montelukast (SINGULAIR) 10 MG tablet TAKE 1 TABLET BY MOUTH EVERYDAY AT BEDTIME 90 tablet 1   olmesartan (BENICAR) 40 MG tablet TAKE 1 TABLET BY MOUTH EVERY DAY 90 tablet 1   ondansetron (ZOFRAN) 4 MG tablet Take 1 tablet (4 mg total) by mouth every 8 (eight) hours as needed for nausea or vomiting. 40 tablet 3   No current facility-administered medications on file prior to visit.   Past Medical History:  Diagnosis Date   Arm pain    Asthma    COVID-19 07/05/2020   Hypertension    Pregnancy induced hypertension    Tingling    Past Surgical History:  Procedure Laterality Date   TUBAL LIGATION     1999    Family History  Problem Relation Age of Onset   Cataracts Mother    Other Father        unsure of medical history   Diabetes Neg Hx    Hypertension Neg Hx    Coronary artery disease Neg Hx    Social History   Socioeconomic History   Marital status: Married    Spouse name: Not on file   Number of children: 1   Years of education: some college   Highest education level: Not on file  Occupational History   Occupation: works w/ [employer]     Employer: [employer]  Tobacco Use   Smoking  status: Never   Smokeless tobacco: Never  Substance and Sexual Activity   Alcohol use: No   Drug use: No   Sexual activity: Yes    Partners: Male  Other Topics Concern   Not on file  Social History Narrative   Married, 1 biological child, 2 adopted children.   Employed full time. Walks 20 min twice/week.    Right-handed.   1-5 glasses of soft drinks per day.   Social Determinants of Health   Financial Resource Strain: Not on file  Food Insecurity: Not on file  Transportation Needs: Not on file  Physical Activity: Not on file  Stress: Not on file  Social Connections: Not on file    Review of Systems   Objective:  There were no vitals taken for this visit.     10/19/2021    8:57 AM 12/16/2020    4:16 AM 12/16/2020    3:19 AM  BP/Weight  Systolic BP 130 104 113  Diastolic BP 80 66 59  Wt. (Lbs) 143    BMI 26.16 kg/m2      Physical Exam  Diabetic Foot Exam - Simple   No data filed      Lab Results  Component Value Date  WBC 13.8 (H) 12/15/2020   HGB 13.7 12/15/2020   HCT 39.1 12/15/2020   PLT 343 12/15/2020   GLUCOSE 115 (H) 12/15/2020   ALT 16 12/14/2020   AST 11 12/14/2020   NA 135 12/15/2020   K 4.0 12/15/2020   CL 100 12/15/2020   CREATININE 1.01 (H) 12/15/2020   BUN 15 12/15/2020   CO2 25 12/15/2020   TSH 0.756 12/14/2020      Assessment & Plan:   Problem List Items Addressed This Visit   None  .  No orders of the defined types were placed in this encounter.   No orders of the defined types were placed in this encounter.    Follow-up: No follow-ups on file.  An After Visit Summary was printed and given to the patient.  Brent Bulla, MD [facility] [phone]

## 2022-03-10 ENCOUNTER — Other Ambulatory Visit: Payer: Self-pay | Admitting: Legal Medicine

## 2022-03-10 DIAGNOSIS — R0602 Shortness of breath: Secondary | ICD-10-CM

## 2022-03-29 NOTE — Progress Notes (Signed)
This encounter was created in error - please disregard.

## 2022-05-03 DIAGNOSIS — J4520 Mild intermittent asthma, uncomplicated: Secondary | ICD-10-CM | POA: Diagnosis not present

## 2022-05-03 DIAGNOSIS — G8929 Other chronic pain: Secondary | ICD-10-CM | POA: Diagnosis not present

## 2022-05-03 DIAGNOSIS — Z23 Encounter for immunization: Secondary | ICD-10-CM | POA: Diagnosis not present

## 2022-05-03 DIAGNOSIS — M797 Fibromyalgia: Secondary | ICD-10-CM | POA: Diagnosis not present

## 2022-05-03 DIAGNOSIS — M25561 Pain in right knee: Secondary | ICD-10-CM | POA: Diagnosis not present

## 2022-05-06 DIAGNOSIS — J4520 Mild intermittent asthma, uncomplicated: Secondary | ICD-10-CM | POA: Insufficient documentation

## 2022-05-06 DIAGNOSIS — G8929 Other chronic pain: Secondary | ICD-10-CM | POA: Insufficient documentation

## 2022-05-12 DIAGNOSIS — M545 Low back pain, unspecified: Secondary | ICD-10-CM | POA: Diagnosis not present

## 2022-05-23 DIAGNOSIS — M25561 Pain in right knee: Secondary | ICD-10-CM | POA: Diagnosis not present

## 2022-05-23 DIAGNOSIS — G8929 Other chronic pain: Secondary | ICD-10-CM | POA: Diagnosis not present

## 2022-06-06 DIAGNOSIS — M25561 Pain in right knee: Secondary | ICD-10-CM | POA: Diagnosis not present

## 2022-06-13 DIAGNOSIS — G8929 Other chronic pain: Secondary | ICD-10-CM | POA: Diagnosis not present

## 2022-06-13 DIAGNOSIS — M25561 Pain in right knee: Secondary | ICD-10-CM | POA: Diagnosis not present

## 2022-08-29 ENCOUNTER — Telehealth: Payer: Self-pay | Admitting: Family Medicine

## 2022-08-29 NOTE — Telephone Encounter (Signed)
Needs appt. Dr. Tobie Poet

## 2022-10-16 NOTE — Telephone Encounter (Signed)
Patient has been seen by [facility] primary care on sunset in [place]. The patient called today to see about an appointment. Patient notified that she would need to f.up with her new provider for a medication refill.

## 2022-11-02 DIAGNOSIS — Z13220 Encounter for screening for lipoid disorders: Secondary | ICD-10-CM | POA: Diagnosis not present

## 2022-11-02 DIAGNOSIS — M797 Fibromyalgia: Secondary | ICD-10-CM | POA: Diagnosis not present

## 2022-11-02 DIAGNOSIS — Z131 Encounter for screening for diabetes mellitus: Secondary | ICD-10-CM | POA: Diagnosis not present

## 2022-11-02 DIAGNOSIS — Z124 Encounter for screening for malignant neoplasm of cervix: Secondary | ICD-10-CM | POA: Diagnosis not present

## 2022-11-02 DIAGNOSIS — N888 Other specified noninflammatory disorders of cervix uteri: Secondary | ICD-10-CM | POA: Diagnosis not present

## 2022-11-02 DIAGNOSIS — J4540 Moderate persistent asthma, uncomplicated: Secondary | ICD-10-CM | POA: Diagnosis not present

## 2022-11-02 DIAGNOSIS — I10 Essential (primary) hypertension: Secondary | ICD-10-CM | POA: Diagnosis not present

## 2022-11-02 DIAGNOSIS — Z0000 Encounter for general adult medical examination without abnormal findings: Secondary | ICD-10-CM | POA: Diagnosis not present

## 2022-11-16 DIAGNOSIS — R928 Other abnormal and inconclusive findings on diagnostic imaging of breast: Secondary | ICD-10-CM | POA: Diagnosis not present

## 2022-11-16 DIAGNOSIS — Z1231 Encounter for screening mammogram for malignant neoplasm of breast: Secondary | ICD-10-CM | POA: Diagnosis not present

## 2022-11-16 LAB — HM MAMMOGRAPHY

## 2022-12-05 ENCOUNTER — Other Ambulatory Visit: Payer: Self-pay | Admitting: Family Medicine

## 2022-12-06 DIAGNOSIS — R92332 Mammographic heterogeneous density, left breast: Secondary | ICD-10-CM | POA: Diagnosis not present

## 2022-12-06 DIAGNOSIS — R928 Other abnormal and inconclusive findings on diagnostic imaging of breast: Secondary | ICD-10-CM | POA: Diagnosis not present

## 2022-12-06 DIAGNOSIS — N6002 Solitary cyst of left breast: Secondary | ICD-10-CM | POA: Diagnosis not present

## 2022-12-25 DIAGNOSIS — W57XXXA Bitten or stung by nonvenomous insect and other nonvenomous arthropods, initial encounter: Secondary | ICD-10-CM | POA: Diagnosis not present

## 2022-12-25 DIAGNOSIS — S40862A Insect bite (nonvenomous) of left upper arm, initial encounter: Secondary | ICD-10-CM | POA: Diagnosis not present

## 2022-12-25 DIAGNOSIS — R7303 Prediabetes: Secondary | ICD-10-CM | POA: Diagnosis not present

## 2022-12-25 DIAGNOSIS — R072 Precordial pain: Secondary | ICD-10-CM | POA: Diagnosis not present

## 2022-12-27 DIAGNOSIS — R1312 Dysphagia, oropharyngeal phase: Secondary | ICD-10-CM | POA: Diagnosis not present

## 2022-12-27 DIAGNOSIS — Z01818 Encounter for other preprocedural examination: Secondary | ICD-10-CM | POA: Diagnosis not present

## 2022-12-27 DIAGNOSIS — Z1211 Encounter for screening for malignant neoplasm of colon: Secondary | ICD-10-CM | POA: Diagnosis not present

## 2023-01-01 DIAGNOSIS — S0180XA Unspecified open wound of other part of head, initial encounter: Secondary | ICD-10-CM | POA: Diagnosis not present

## 2023-01-02 ENCOUNTER — Other Ambulatory Visit: Payer: Self-pay | Admitting: Family Medicine

## 2023-01-22 DIAGNOSIS — Z8719 Personal history of other diseases of the digestive system: Secondary | ICD-10-CM | POA: Diagnosis not present

## 2023-01-22 DIAGNOSIS — R059 Cough, unspecified: Secondary | ICD-10-CM | POA: Diagnosis not present

## 2023-01-22 DIAGNOSIS — J9600 Acute respiratory failure, unspecified whether with hypoxia or hypercapnia: Secondary | ICD-10-CM | POA: Diagnosis not present

## 2023-01-22 DIAGNOSIS — Y92234 Operating room of hospital as the place of occurrence of the external cause: Secondary | ICD-10-CM | POA: Diagnosis not present

## 2023-01-22 DIAGNOSIS — Y838 Other surgical procedures as the cause of abnormal reaction of the patient, or of later complication, without mention of misadventure at the time of the procedure: Secondary | ICD-10-CM | POA: Diagnosis not present

## 2023-01-22 DIAGNOSIS — R109 Unspecified abdominal pain: Secondary | ICD-10-CM | POA: Diagnosis not present

## 2023-01-22 DIAGNOSIS — Z7951 Long term (current) use of inhaled steroids: Secondary | ICD-10-CM | POA: Diagnosis not present

## 2023-01-22 DIAGNOSIS — R509 Fever, unspecified: Secondary | ICD-10-CM | POA: Diagnosis not present

## 2023-01-22 DIAGNOSIS — G8929 Other chronic pain: Secondary | ICD-10-CM | POA: Diagnosis not present

## 2023-01-22 DIAGNOSIS — R1312 Dysphagia, oropharyngeal phase: Secondary | ICD-10-CM | POA: Diagnosis not present

## 2023-01-22 DIAGNOSIS — Z792 Long term (current) use of antibiotics: Secondary | ICD-10-CM | POA: Diagnosis not present

## 2023-01-22 DIAGNOSIS — T8144XA Sepsis following a procedure, initial encounter: Secondary | ICD-10-CM | POA: Diagnosis not present

## 2023-01-22 DIAGNOSIS — J690 Pneumonitis due to inhalation of food and vomit: Secondary | ICD-10-CM | POA: Diagnosis not present

## 2023-01-22 DIAGNOSIS — Z1211 Encounter for screening for malignant neoplasm of colon: Secondary | ICD-10-CM | POA: Diagnosis not present

## 2023-01-22 DIAGNOSIS — J45909 Unspecified asthma, uncomplicated: Secondary | ICD-10-CM | POA: Diagnosis not present

## 2023-01-22 DIAGNOSIS — K449 Diaphragmatic hernia without obstruction or gangrene: Secondary | ICD-10-CM | POA: Diagnosis not present

## 2023-01-22 DIAGNOSIS — R918 Other nonspecific abnormal finding of lung field: Secondary | ICD-10-CM | POA: Diagnosis not present

## 2023-01-22 DIAGNOSIS — I10 Essential (primary) hypertension: Secondary | ICD-10-CM | POA: Diagnosis not present

## 2023-01-22 DIAGNOSIS — R1310 Dysphagia, unspecified: Secondary | ICD-10-CM | POA: Diagnosis not present

## 2023-01-22 DIAGNOSIS — I517 Cardiomegaly: Secondary | ICD-10-CM | POA: Diagnosis not present

## 2023-01-22 DIAGNOSIS — J984 Other disorders of lung: Secondary | ICD-10-CM | POA: Diagnosis not present

## 2023-01-22 DIAGNOSIS — M797 Fibromyalgia: Secondary | ICD-10-CM | POA: Diagnosis not present

## 2023-01-22 DIAGNOSIS — Z79899 Other long term (current) drug therapy: Secondary | ICD-10-CM | POA: Diagnosis not present

## 2023-01-22 DIAGNOSIS — A419 Sepsis, unspecified organism: Secondary | ICD-10-CM | POA: Diagnosis not present

## 2023-01-22 DIAGNOSIS — R197 Diarrhea, unspecified: Secondary | ICD-10-CM | POA: Diagnosis not present

## 2023-01-30 DIAGNOSIS — G44201 Tension-type headache, unspecified, intractable: Secondary | ICD-10-CM | POA: Diagnosis not present

## 2023-01-30 DIAGNOSIS — R030 Elevated blood-pressure reading, without diagnosis of hypertension: Secondary | ICD-10-CM | POA: Diagnosis not present

## 2023-02-02 DIAGNOSIS — R0609 Other forms of dyspnea: Secondary | ICD-10-CM | POA: Diagnosis not present

## 2023-02-02 DIAGNOSIS — R0600 Dyspnea, unspecified: Secondary | ICD-10-CM | POA: Diagnosis not present

## 2023-02-02 DIAGNOSIS — R030 Elevated blood-pressure reading, without diagnosis of hypertension: Secondary | ICD-10-CM | POA: Diagnosis not present

## 2023-02-14 DIAGNOSIS — J690 Pneumonitis due to inhalation of food and vomit: Secondary | ICD-10-CM | POA: Diagnosis not present

## 2023-02-14 DIAGNOSIS — R030 Elevated blood-pressure reading, without diagnosis of hypertension: Secondary | ICD-10-CM | POA: Diagnosis not present

## 2023-02-14 DIAGNOSIS — Z09 Encounter for follow-up examination after completed treatment for conditions other than malignant neoplasm: Secondary | ICD-10-CM | POA: Diagnosis not present

## 2023-03-21 DIAGNOSIS — R1110 Vomiting, unspecified: Secondary | ICD-10-CM | POA: Diagnosis not present

## 2023-03-21 DIAGNOSIS — J690 Pneumonitis due to inhalation of food and vomit: Secondary | ICD-10-CM | POA: Diagnosis not present

## 2023-03-21 DIAGNOSIS — K449 Diaphragmatic hernia without obstruction or gangrene: Secondary | ICD-10-CM | POA: Diagnosis not present

## 2023-05-25 ENCOUNTER — Ambulatory Visit: Payer: BC Managed Care – PPO

## 2023-05-25 VITALS — BP 110/68 | HR 69 | Temp 98.5°F | Resp 16 | Ht 62.0 in | Wt 149.0 lb

## 2023-05-25 DIAGNOSIS — I10 Essential (primary) hypertension: Secondary | ICD-10-CM | POA: Diagnosis not present

## 2023-05-25 DIAGNOSIS — Z0000 Encounter for general adult medical examination without abnormal findings: Secondary | ICD-10-CM | POA: Insufficient documentation

## 2023-05-25 DIAGNOSIS — R519 Headache, unspecified: Secondary | ICD-10-CM | POA: Diagnosis not present

## 2023-05-25 DIAGNOSIS — R0602 Shortness of breath: Secondary | ICD-10-CM

## 2023-05-25 DIAGNOSIS — G8929 Other chronic pain: Secondary | ICD-10-CM | POA: Insufficient documentation

## 2023-05-25 DIAGNOSIS — M797 Fibromyalgia: Secondary | ICD-10-CM | POA: Insufficient documentation

## 2023-05-25 MED ORDER — PROPRANOLOL HCL 20 MG PO TABS: 20.0000 mg | ORAL_TABLET | Freq: Two times a day (BID) | ORAL | 2 refills | Status: DC

## 2023-05-25 MED ORDER — MONTELUKAST SODIUM 10 MG PO TABS: 10.0000 mg | ORAL_TABLET | Freq: Every day | ORAL | 0 refills | Status: DC

## 2023-05-25 MED ORDER — OLMESARTAN MEDOXOMIL 40 MG PO TABS: 40.0000 mg | ORAL_TABLET | Freq: Every day | ORAL | 0 refills | Status: DC

## 2023-05-25 NOTE — Patient Instructions (Signed)
Added a new medicine PROPRANOLOL 20 mg to take twice daily along with olmesartan Increase exercise and add strength training No blood work at this time Please make a bp log at home Come back in 6 weeks,

## 2023-05-25 NOTE — Progress Notes (Signed)
Established Patient Office Visit  Subjective   Patient ID: Alison Simpson, female    DOB: 1971-05-10  Age: 52 y.o. MRN: [id number]  Chief Complaint  Patient presents with   Establish Care    HPI  The patient, previously under the care of Dr. Marina Goodell, presents as a new patient due to a change in insurance. She has a history of hypertension, with persistently elevated blood pressure despite medication. The patient reports that her blood pressure readings at home are typically in the 130s-140s over 80s-90s, even while on Olmesartan. She previously tried Lisinopril, but it was discontinued due to associated migraines.   The patient has been experiencing recurrent headaches, which she believes are related to her blood pressure. She describes waking up with headaches that persist throughout the day, characterized by pressure across the top of her head. She also reports a severe episode of headache at work, which was accompanied by nausea and dizziness. The headache was sudden in onset, lasted for about 30 minutes, and was followed by several hours of residual symptoms.   The patient also noted a sharp pain behind her left eye during this episode. She has a past history of migraines, but has not experienced them for approximately 20 years until recently. In addition to hypertension and migraines, the patient has a diagnosis of fibromyalgia. She has tried Cymbalta and Gabapentin for this condition, but both were discontinued due to adverse effects.   The patient reports that her fibromyalgia pain is currently manageable, with flare-ups occurring occasionally.   The patient was hospitalized in June for aspiration pneumonia, which occurred during a colonoscopy procedure. She spent five days in the hospital and has since recovered, with a follow-up chest x-ray showing resolution of the pneumonia. She also underwent an upper GI endoscopy at the same time as the colonoscopy, which was normal. In terms of  preventive care, the patient had a mammogram in June that showed a possible asymmetry in the left breast, warranting further evaluation.   She plans to schedule a follow-up mammogram in December. The patient is currently employed and works 12-hour shifts. She acknowledges that she could improve her diet and exercise habits. She is not a big fan of vegetables but is trying to incorporate more into her diet. She also plans to start exercising more on her days off from work.    Review of Systems  Constitutional: Negative.   HENT: Negative.    Eyes: Negative.   Respiratory: Negative.    Cardiovascular: Negative.   Gastrointestinal: Negative.   Musculoskeletal: Negative.   Neurological:  Positive for headaches.  All other systems reviewed and are negative.     Objective:     BP 110/68 (BP Location: Right Arm, Patient Position: Sitting, Cuff Size: Normal)   Pulse 69   Temp 98.5 F (36.9 C) (Temporal)   Resp 16   Ht 5\' 2"  (1.575 m)   Wt 149 lb (67.6 kg)   SpO2 99%   BMI 27.25 kg/m    Physical Exam Vitals and nursing note reviewed.  Constitutional:      Appearance: Normal appearance.  HENT:     Head: Normocephalic.  Cardiovascular:     Rate and Rhythm: Normal rate and regular rhythm.  Pulmonary:     Effort: Pulmonary effort is normal.     Breath sounds: Normal breath sounds.  Musculoskeletal:        General: Normal range of motion.  Skin:    General: Skin is warm.  Neurological:     General: No focal deficit present.     Mental Status: She is alert and oriented to person, place, and time.  Psychiatric:        Mood and Affect: Mood normal.        Behavior: Behavior normal.      No results found for any visits on 05/25/23.    The 10-year ASCVD risk score (Arnett DK, et al., 2019) is: 1.3%    Assessment & Plan:   Problem List Items Addressed This Visit     Benign essential HTN - Primary    Hypertension with current BP readings averaging 130s-140s/80s-90s.   Persistent high diastolic pressure and associated migraines. Recent severe migraine episode with nausea and dizziness, possibly related to BP fluctuations. Discussed adding propranolol to help with both BP and migraines. Explained propranolol's dual benefit for BP and headache management. Emphasized lifestyle changes including increased exercise and reduced salt intake.   - Add propranolol 20 mg twice daily in addition to olmesartan - Increase exercise and add strength training - Avoid excess salt and processed foods - Maintain a BP log at home - Follow up in six weeks for BP check       Relevant Medications   propranolol (INDERAL) 20 MG tablet   olmesartan (BENICAR) 40 MG tablet   Chronic nonintractable headache    Migraines, previously well-controlled, now recurring with hypertension. Recent severe episode with visual disturbances, nausea, and photophobia. Migraines may be related to BP fluctuations. Discussed propranolol for managing both migraines and BP. Explained propranolol's efficacy in reducing migraine frequency and severity. - Add propranolol 20 mg twice daily to help with migraines and BP - Increase exercise and add strength training - Maintain a headache diary to track frequency and triggers       Relevant Medications   propranolol (INDERAL) 20 MG tablet   Fibromyalgia    Fibromyalgia with previous trials of Cymbalta and gabapentin, both poorly tolerated. Currently not on specific medication. Symptoms manageable with occasional flare-ups. Discussed exercise and strength training for symptom management. Plan to reassess fibromyalgia management after addressing BP and migraines. - Focus on increasing exercise and strength training to help manage symptoms - Reassess fibromyalgia management in six weeks after addressing BP and migraines       Healthcare maintenance    Generally adherent to health maintenance. Recent blood work, chest x-ray, and colonoscopy were normal. Mammogram showed  possible asymmetry in the left breast, requiring follow-up. Discussed importance of follow-up imaging to rule out any issues. - Schedule follow-up mammogram and possible ultrasound for left breast in December - Continue current diet and fiber supplement as recommended after colonoscopy Follow-up - Follow up in six weeks for BP check and reassessment of migraines and fibromyalgia - Ensure all prescriptions (propranolol, olmesartan, Singulair) are filled with a three-month supply.       Other Visit Diagnoses     DYSPNEA       Relevant Medications   montelukast (SINGULAIR) 10 MG tablet       No follow-ups on file.    Windell Moment, MD

## 2023-05-25 NOTE — Assessment & Plan Note (Signed)
Hypertension with current BP readings averaging 130s-140s/80s-90s.  Persistent high diastolic pressure and associated migraines. Recent severe migraine episode with nausea and dizziness, possibly related to BP fluctuations. Discussed adding propranolol to help with both BP and migraines. Explained propranolol's dual benefit for BP and headache management. Emphasized lifestyle changes including increased exercise and reduced salt intake.   - Add propranolol 20 mg twice daily in addition to olmesartan - Increase exercise and add strength training - Avoid excess salt and processed foods - Maintain a BP log at home - Follow up in six weeks for BP check

## 2023-05-25 NOTE — Assessment & Plan Note (Signed)
Migraines, previously well-controlled, now recurring with hypertension. Recent severe episode with visual disturbances, nausea, and photophobia. Migraines may be related to BP fluctuations. Discussed propranolol for managing both migraines and BP. Explained propranolol's efficacy in reducing migraine frequency and severity. - Add propranolol 20 mg twice daily to help with migraines and BP - Increase exercise and add strength training - Maintain a headache diary to track frequency and triggers

## 2023-05-25 NOTE — Assessment & Plan Note (Signed)
Fibromyalgia with previous trials of Cymbalta and gabapentin, both poorly tolerated. Currently not on specific medication. Symptoms manageable with occasional flare-ups. Discussed exercise and strength training for symptom management. Plan to reassess fibromyalgia management after addressing BP and migraines. - Focus on increasing exercise and strength training to help manage symptoms - Reassess fibromyalgia management in six weeks after addressing BP and migraines

## 2023-05-25 NOTE — Assessment & Plan Note (Signed)
Generally adherent to health maintenance. Recent blood work, chest x-ray, and colonoscopy were normal. Mammogram showed possible asymmetry in the left breast, requiring follow-up. Discussed importance of follow-up imaging to rule out any issues. - Schedule follow-up mammogram and possible ultrasound for left breast in December - Continue current diet and fiber supplement as recommended after colonoscopy Follow-up - Follow up in six weeks for BP check and reassessment of migraines and fibromyalgia - Ensure all prescriptions (propranolol, olmesartan, Singulair) are filled with a three-month supply.

## 2023-05-30 ENCOUNTER — Telehealth: Payer: Self-pay

## 2023-05-30 NOTE — Telephone Encounter (Signed)
Copied from CRM [id number]. Topic: Clinical - Request for Lab/Test Order >> May 30, 2023  9:29 AM Gaetano Hawthorne wrote: Reason for CRM: Patient is calling to have a mammogram order sent over to [facility] - their fax number is [phone] And their phone number is [phone]. Patient needs the order sent over before they can schedule.

## 2023-06-04 ENCOUNTER — Other Ambulatory Visit: Payer: Self-pay

## 2023-06-04 DIAGNOSIS — Z1231 Encounter for screening mammogram for malignant neoplasm of breast: Secondary | ICD-10-CM

## 2023-06-04 NOTE — Telephone Encounter (Signed)
Called patient made her aware order has been faxed.

## 2023-06-08 ENCOUNTER — Telehealth: Payer: Self-pay

## 2023-06-08 NOTE — Telephone Encounter (Signed)
   Alison Simpson has been scheduled for the following appointment:  WHAT: DIAGNOSTIC MAMMOGRAM WHERE: [facility] DATE: 07/11/2023 TIME: 10:10 AM CHECK-IN  Patient has been made aware.

## 2023-06-22 DIAGNOSIS — L989 Disorder of the skin and subcutaneous tissue, unspecified: Secondary | ICD-10-CM | POA: Diagnosis not present

## 2023-07-01 DIAGNOSIS — R072 Precordial pain: Secondary | ICD-10-CM | POA: Diagnosis not present

## 2023-07-01 DIAGNOSIS — S29011A Strain of muscle and tendon of front wall of thorax, initial encounter: Secondary | ICD-10-CM | POA: Diagnosis not present

## 2023-07-08 ENCOUNTER — Other Ambulatory Visit: Payer: Self-pay

## 2023-07-08 DIAGNOSIS — R0602 Shortness of breath: Secondary | ICD-10-CM

## 2023-07-09 MED ORDER — OLMESARTAN MEDOXOMIL 40 MG PO TABS: 40.0000 mg | ORAL_TABLET | Freq: Every day | ORAL | 0 refills | Status: DC

## 2023-07-09 MED ORDER — MONTELUKAST SODIUM 10 MG PO TABS: 10.0000 mg | ORAL_TABLET | Freq: Every day | ORAL | 0 refills | Status: DC

## 2023-07-10 DIAGNOSIS — R519 Headache, unspecified: Secondary | ICD-10-CM | POA: Diagnosis not present

## 2023-07-10 DIAGNOSIS — R0981 Nasal congestion: Secondary | ICD-10-CM | POA: Diagnosis not present

## 2023-07-12 ENCOUNTER — Ambulatory Visit: Payer: BC Managed Care – PPO

## 2023-07-12 DIAGNOSIS — R0981 Nasal congestion: Secondary | ICD-10-CM | POA: Diagnosis not present

## 2023-07-17 DIAGNOSIS — R928 Other abnormal and inconclusive findings on diagnostic imaging of breast: Secondary | ICD-10-CM | POA: Diagnosis not present

## 2023-07-17 LAB — HM MAMMOGRAPHY

## 2023-07-31 ENCOUNTER — Ambulatory Visit (INDEPENDENT_AMBULATORY_CARE_PROVIDER_SITE_OTHER): Payer: BC Managed Care – PPO

## 2023-07-31 DIAGNOSIS — Z23 Encounter for immunization: Secondary | ICD-10-CM

## 2023-07-31 NOTE — Progress Notes (Signed)
Patient is in office today for a nurse visit for Immunization and Flu and Covid . Patient Injection was given in the  Right deltoid. Patient tolerated injection well.

## 2023-08-23 ENCOUNTER — Other Ambulatory Visit: Payer: Self-pay

## 2023-08-23 DIAGNOSIS — R0602 Shortness of breath: Secondary | ICD-10-CM

## 2023-08-23 MED ORDER — MONTELUKAST SODIUM 10 MG PO TABS: 10.0000 mg | ORAL_TABLET | Freq: Every day | ORAL | 0 refills | Status: DC

## 2023-08-23 MED ORDER — OLMESARTAN MEDOXOMIL 40 MG PO TABS: 40.0000 mg | ORAL_TABLET | Freq: Every day | ORAL | 0 refills | Status: DC

## 2023-09-23 DIAGNOSIS — R519 Headache, unspecified: Secondary | ICD-10-CM | POA: Diagnosis not present

## 2023-09-23 DIAGNOSIS — R9431 Abnormal electrocardiogram [ECG] [EKG]: Secondary | ICD-10-CM | POA: Diagnosis not present

## 2023-09-23 LAB — LAB REPORT - SCANNED
EGFR: 109
POC INR: 0.9

## 2023-10-04 ENCOUNTER — Ambulatory Visit

## 2023-10-04 VITALS — BP 110/78 | HR 65 | Temp 97.6°F | Ht 62.0 in | Wt 144.8 lb

## 2023-10-04 DIAGNOSIS — M7918 Myalgia, other site: Secondary | ICD-10-CM | POA: Diagnosis not present

## 2023-10-04 DIAGNOSIS — G8929 Other chronic pain: Secondary | ICD-10-CM | POA: Insufficient documentation

## 2023-10-04 DIAGNOSIS — H6123 Impacted cerumen, bilateral: Secondary | ICD-10-CM | POA: Insufficient documentation

## 2023-10-04 DIAGNOSIS — R519 Headache, unspecified: Secondary | ICD-10-CM | POA: Diagnosis not present

## 2023-10-04 MED ORDER — PREGABALIN 75 MG PO CAPS: 75.0000 mg | ORAL_CAPSULE | Freq: Two times a day (BID) | ORAL | 2 refills | Status: DC

## 2023-10-04 NOTE — Assessment & Plan Note (Signed)
 the CT scan noted a large amount of cerumen in the left ear canal, and she reported pain in the right ear. Significant cerumen was observed in both ears, potentially contributing to her symptoms. Ear lavage was recommended to remove the impacted cerumen. - Performed ear lavage on both ears to remove impacted cerumen.and we were able to only remove some cerumen from both ear canals.  - advised to use DEBROX ear drops at home - return if she wishes to have another ear lavage done

## 2023-10-04 NOTE — Progress Notes (Signed)
 Subjective:  Patient ID: Alison Simpson, female    DOB: June 05, 1971  Age: 53 y.o. MRN: [id number]  Chief Complaint  Patient presents with   Hospitalization Follow-up    Discussed the use of AI scribe software for clinical note transcription with the patient, who gave verbal consent to proceed.   Alison Simpson is a 53 year old female with migraines and suspected fibromyalgia who presents with new onset head sensitivity and cognitive issues.  She has a long-standing history of migraines but recently experienced a new type of head pain that led to an emergency room visit on September 23, 2023. She describes a specific spot on the top of her head that was extremely sensitive to touch, causing her to 'literally black out' from the pain. This sensitivity lasted about two weeks and has since resolved. A CT scan performed during her emergency room visit showed no acute intracranial abnormalities but did note a large amount of cerumen in the left ear and chronic left maxillary sinusitis. Her EKG showed normal sinus rhythm, and blood work, including white cell count, hemoglobin, electrolytes, kidney function, liver numbers, and troponins, were all normal.  In addition to the head sensitivity, she has been experiencing cognitive issues such as memory loss and clumsiness, which are new and concerning. She feels 'crazy' and 'like I'm drugged' with episodes of forgetfulness and dropping objects. These symptoms have been ongoing for many years but have worsened recently.  She has a history of fibromyalgia-like symptoms, with daily pain that varies in location and intensity. The pain is described as burning, stabbing, or tingling, occurring in different parts of her body each day. She has tried gabapentin and Cymbalta in the past for pain management but did not find them effective and experienced adverse effects with Cymbalta.  She reports episodes of extreme fatigue, feeling like she could 'sleep twenty four hours a  day,' and has caught herself dozing off at work. No known sleep apnea and does not snore according to her own knowledge.  Her medication regimen includes ibuprofen and Tylenol for pain, Benicar (olmesartan) 40 mg daily and propranolol 20 mg twice daily for blood pressure, and Symbicort, Singulair, and albuterol for asthma. She has not been prescribed any new medications since her emergency room visit.      10/04/2023    7:57 AM 05/25/2023   10:54 AM 04/29/2020    2:50 PM  Depression screen PHQ 2/9  Decreased Interest 0 0 0  Down, Depressed, Hopeless 0 0 0  PHQ - 2 Score 0 0 0  Altered sleeping  0   Tired, decreased energy  0   Change in appetite  0   Feeling bad or failure about yourself   0   Trouble concentrating  0   Moving slowly or fidgety/restless  0   Suicidal thoughts  0   PHQ-9 Score  0   Difficult doing work/chores  Not difficult at all         10/04/2023    7:57 AM  Fall Risk   Falls in the past year? 0  Number falls in past yr: 0  Injury with Fall? 0  Risk for fall due to : No Fall Risks    Patient Care Team: Windell Moment, MD as PCP - General (Family Medicine)   Review of Systems  Constitutional:  Negative for chills, fatigue and fever.  HENT:  Negative for congestion, ear pain and sore throat.   Respiratory:  Negative for cough and  shortness of breath.   Cardiovascular:  Negative for chest pain.  Gastrointestinal:  Negative for abdominal pain, constipation, diarrhea, nausea and vomiting.  Genitourinary:  Negative for dysuria and frequency.  Musculoskeletal:  Positive for myalgias and neck pain. Negative for arthralgias.  Neurological:  Positive for headaches. Negative for dizziness.  Psychiatric/Behavioral:  Negative for dysphoric mood. The patient is not nervous/anxious.   All other systems reviewed and are negative.   Current Outpatient Medications on File Prior to Visit  Medication Sig Dispense Refill   albuterol (PROVENTIL) (2.5 MG/3ML) 0.083%  nebulizer solution Take 3 mLs (2.5 mg total) by nebulization every 6 (six) hours as needed. 75 mL 0   montelukast (SINGULAIR) 10 MG tablet Take 1 tablet (10 mg total) by mouth at bedtime. 90 tablet 0   olmesartan (BENICAR) 40 MG tablet Take 1 tablet (40 mg total) by mouth daily. 90 tablet 0   propranolol (INDERAL) 20 MG tablet Take 1 tablet (20 mg total) by mouth 2 (two) times daily. 60 tablet 2   budesonide-formoterol (SYMBICORT) 80-4.5 MCG/ACT inhaler Inhale 2 puffs into the lungs 2 (two) times daily. (Patient not taking: Reported on 10/04/2023) 1 each 12   No current facility-administered medications on file prior to visit.   Past Medical History:  Diagnosis Date   Arm pain    Asthma    COVID-19 07/05/2020   Hypertension    Pregnancy induced hypertension    Tingling    Past Surgical History:  Procedure Laterality Date   TUBAL LIGATION     1999    Family History  Problem Relation Age of Onset   Cataracts Mother    Other Father        unsure of medical history   Diabetes Neg Hx    Hypertension Neg Hx    Coronary artery disease Neg Hx    Social History   Socioeconomic History   Marital status: Married    Spouse name: Not on file   Number of children: 1   Years of education: some college   Highest education level: GED or equivalent  Occupational History   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   Employed full time. Walks 20 min twice/week.    Right-handed.   1-5 glasses of soft drinks per day.   Social Drivers of Corporate investment banker Strain: Low Risk  (10/04/2023)   Overall Financial Resource Strain (CARDIA)    Difficulty of Paying Living  Expenses: Not hard at all  Food Insecurity: No Food Insecurity (10/04/2023)   Hunger Vital Sign    Worried About Running Out of Food in the Last Year: Never true    Ran Out of Food in the Last Year: Never true  Transportation Needs: No Transportation Needs (10/04/2023)   PRAPARE - Administrator, Civil Service (Medical): No    Lack of Transportation (Non-Medical): No  Physical Activity: Inactive (10/04/2023)   Exercise Vital Sign    Days of Exercise per Week: 0 days    Minutes of Exercise per Session: 0 min  Stress: No Stress Concern Present (10/04/2023)  [employer] of Occupational Health - Occupational Stress Questionnaire    Feeling of Stress : Not at all  Social Connections: Moderately Integrated (10/04/2023)   Social Connection and Isolation Panel [NHANES]    Frequency of Communication with Friends and Family: More than three times a week    Frequency of Social Gatherings with Friends and Family: Once a week    Attends Religious Services: More than 4 times per year    Active Member of [employer] or Organizations: No    Attends Banker Meetings: Never    Marital Status: Married    Objective:  BP 110/78   Pulse 65   Temp 97.6 F (36.4 C)   Ht 5\' 2"  (1.575 m)   Wt 144 lb 12.8 oz (65.7 kg)   SpO2 98%   BMI 26.48 kg/m      10/04/2023    7:49 AM 05/25/2023   11:32 AM 05/25/2023   10:54 AM  BP/Weight  Systolic BP 110 110 144  Diastolic BP 78 68 82  Wt. (Lbs) 144.8  149  BMI 26.48 kg/m2  27.25 kg/m2    Physical Exam Vitals and nursing note reviewed.  Constitutional:      Appearance: Normal appearance.  HENT:     Head: Normocephalic and atraumatic.     Ears:     Comments: Impacted cerumen in both ear canals Eyes:     Pupils: Pupils are equal, round, and reactive to light.  Cardiovascular:     Rate and Rhythm: Normal rate and regular rhythm.  Pulmonary:     Effort: Pulmonary effort is normal.     Breath sounds: Normal breath sounds.   Musculoskeletal:        General: Normal range of motion.     Cervical back: Normal range of motion.  Skin:    General: Skin is warm.  Neurological:     General: No focal deficit present.     Mental Status: She is alert.  Psychiatric:        Mood and Affect: Mood normal.     Diabetic Foot Exam - Simple   No data filed      Lab Results  Component Value Date   WBC 13.8 (H) 12/15/2020   HGB 13.7 12/15/2020   HCT 39.1 12/15/2020   PLT 343 12/15/2020   GLUCOSE 115 (H) 12/15/2020   ALT 16 12/14/2020   AST 11 12/14/2020   NA 135 12/15/2020   K 4.0 12/15/2020   CL 100 12/15/2020   CREATININE 1.01 (H) 12/15/2020   BUN 15 12/15/2020   CO2 25 12/15/2020   TSH 0.756 12/14/2020      Assessment & Plan:  Assessment and Plan       Musculoskeletal pain, chronic Assessment & Plan: Long-standing chronic pain with varying location and intensity, described as burning, stabbing, or tingling, suspected to be related to fibromyalgia. Previous treatments with gabapentin and Cymbalta were ineffective, with Cymbalta causing significant side effects. Lyrica was suggested as a potential treatment option, given its similarity to gabapentin but with potentially better efficacy. Informed consent was obtained, discussing potential side effects such as drowsiness and the need to start at a low dose to assess tolerance. - Prescribe Lyrica 75 mg twice daily, starting with one tablet at bedtime for the first week, then increasing to twice daily if tolerated.   Impacted cerumen of both ears Assessment & Plan: the CT scan noted a large amount of cerumen in the left ear canal, and she  reported pain in the right ear. Significant cerumen was observed in both ears, potentially contributing to her symptoms. Ear lavage was recommended to remove the impacted cerumen. - Performed ear lavage on both ears to remove impacted cerumen.and we were able to only remove some cerumen from both ear canals.  - advised  to use DEBROX ear drops at home - return if she wishes to have another ear lavage done   Scalp pain Assessment & Plan:   Headache New type of headache with sensitivity on the top of her head, different from her usual migraines, accompanied by cognitive symptoms such as memory loss and clumsiness. A CT scan ruled out stroke and intracranial bleed. The headache has since resolved. If headaches persist, a neurology referral could be considered. - Consider neurology referral if headaches persist. - she had normal neurological exam today   Other orders -     Pregabalin; Take 1 capsule (75 mg total) by mouth 2 (two) times daily.  Dispense: 60 capsule; Refill: 2     Meds ordered this encounter  Medications   pregabalin (LYRICA) 75 MG capsule    Sig: Take 1 capsule (75 mg total) by mouth 2 (two) times daily.    Dispense:  60 capsule    Refill:  2    No orders of the defined types were placed in this encounter.    Follow-up: No follow-ups on file.  An After Visit Summary was printed and given to the patient.  Windell Moment, MD [facility] [phone]

## 2023-10-04 NOTE — Assessment & Plan Note (Addendum)
   Headache New type of headache with sensitivity on the top of her head, different from her usual migraines, accompanied by cognitive symptoms such as memory loss and clumsiness. A CT scan ruled out stroke and intracranial bleed. The headache has since resolved. If headaches persist, a neurology referral could be considered. - Consider neurology referral if headaches persist. - she had normal neurological exam today

## 2023-10-04 NOTE — Assessment & Plan Note (Signed)
 Long-standing chronic pain with varying location and intensity, described as burning, stabbing, or tingling, suspected to be related to fibromyalgia. Previous treatments with gabapentin and Cymbalta were ineffective, with Cymbalta causing significant side effects. Lyrica was suggested as a potential treatment option, given its similarity to gabapentin but with potentially better efficacy. Informed consent was obtained, discussing potential side effects such as drowsiness and the need to start at a low dose to assess tolerance. - Prescribe Lyrica 75 mg twice daily, starting with one tablet at bedtime for the first week, then increasing to twice daily if tolerated.

## 2023-10-05 ENCOUNTER — Telehealth: Payer: Self-pay

## 2023-10-05 NOTE — Telephone Encounter (Signed)
 Copied from CRM [id number]. Topic: Clinical - Medication Question >> Oct 05, 2023  8:41 AM Sasha H wrote: Reason for CRM: Pt started pregabalin (LYRICA) 75 MG capsule yesterday and took it last night and states she felt drunk this morning when she woke up and had to take Zofran. Pt wants to know if she can continue to take Zofran with the 1 pill at night until she gets used to it.   Pt states a voicemail can be left of she does not answer.

## 2023-10-17 ENCOUNTER — Other Ambulatory Visit: Payer: Self-pay

## 2023-10-17 NOTE — Telephone Encounter (Signed)
 Copied from CRM [id number]. Topic: Clinical - Medication Refill >> Oct 17, 2023 12:30 PM Everette C wrote: Most Recent Primary Care Visit:  Provider: Loistine Rinne  Department: [facility]  Visit Type: HOSPITAL FOLLOW UP  Date: 10/04/2023  Medication: propranolol (INDERAL) 20 MG tablet [[id number]  Has the patient contacted their pharmacy? Yes (Agent: If no, request that the patient contact the pharmacy for the refill. If patient does not wish to contact the pharmacy document the reason why and proceed with request.) (Agent: If yes, when and what did the pharmacy advise?)  Is this the correct pharmacy for this prescription? Yes If no, delete pharmacy and type the correct one.  This is the patient's preferred pharmacy:  [facility] [address] - [place], [place] - [address] [place] [address] Phone: [phone] Fax: [phone]   Has the prescription been filled recently? Yes  Is the patient out of the medication? Yes  Has the patient been seen for an appointment in the last year OR does the patient have an upcoming appointment? Yes  Can we respond through MyChart? No  Agent: Please be advised that Rx refills may take up to 3 business days. We ask that you follow-up with your pharmacy.

## 2023-10-18 ENCOUNTER — Other Ambulatory Visit: Payer: Self-pay

## 2023-10-18 MED ORDER — PROPRANOLOL HCL 20 MG PO TABS: 20.0000 mg | ORAL_TABLET | Freq: Two times a day (BID) | ORAL | 2 refills | Status: DC

## 2023-10-26 ENCOUNTER — Ambulatory Visit

## 2023-10-26 VITALS — BP 110/80 | HR 64 | Temp 97.8°F | Ht 62.0 in | Wt 148.8 lb

## 2023-10-26 DIAGNOSIS — H9201 Otalgia, right ear: Secondary | ICD-10-CM | POA: Diagnosis not present

## 2023-10-26 DIAGNOSIS — H6123 Impacted cerumen, bilateral: Secondary | ICD-10-CM | POA: Diagnosis not present

## 2023-10-26 NOTE — Progress Notes (Signed)
 Acute Office Visit  Subjective:    Patient ID: Alison Simpson, female    DOB: 1971/01/08, 53 y.o.   MRN: [id number]  Chief Complaint  Patient presents with   Ear Pain    HPI: Discussed the use of AI scribe software for clinical note transcription with the patient, who gave verbal consent to proceed.  History of Present Illness   Alison Simpson is a 53 year old female who presents with ear discomfort and hearing issues.  She experiences persistent ear discomfort and hearing issues following a previous ear cleaning. There is a sensation of plugged ears and difficulty hearing, particularly in the mornings. Vertigo is present, causing imbalance and veering to the right while walking.  There is itching and aching pain in the right ear, accompanied by a noticeable odor likened to 'fish'. She uses Mirawax drops regularly to manage earwax buildup. She has a history of earwax buildup, with a previous CT scan indicating wax in both ears. Despite prior cleaning, she feels the wax was not completely removed. She wears earplugs at work, which may contribute to wax accumulation.  Nasal drainage occurs at night, starting with a stuffy nose, which she attributes to possible allergies, although she is unsure if she is allergic to pollen.  No pain when the left ear is pulled, but certain spots in the right ear are painful when touched. The left ear was previously identified as worse than the right.       Past Medical History:  Diagnosis Date   Arm pain    Asthma    COVID-19 07/05/2020   Hypertension    Pregnancy induced hypertension    Tingling     Past Surgical History:  Procedure Laterality Date   TUBAL LIGATION     1999    Family History  Problem Relation Age of Onset   Cataracts Mother    Other Father        unsure of medical history   Diabetes Neg Hx    Hypertension Neg Hx    Coronary artery disease Neg Hx     Social History   Socioeconomic History   Marital status: Married     Spouse name: Not on file   Number of children: 1   Years of education: some college   Highest education level: GED or equivalent  Occupational History   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   Employed full time. Walks 20 min twice/week.    Right-handed.   1-5 glasses of soft drinks per day.   Social Drivers of Corporate investment banker Strain: Low Risk  (10/04/2023)   Overall Financial Resource Strain (CARDIA)    Difficulty of Paying Living Expenses: Not hard at all  Food Insecurity: No Food Insecurity (10/04/2023)   Hunger Vital Sign    Worried About Running Out of Food in the Last Year: Never true    Ran Out of Food in the Last Year: Never true  Transportation Needs: No Transportation Needs (10/04/2023)   PRAPARE - Administrator, Civil Service (Medical): No  Lack of Transportation (Non-Medical): No  Physical Activity: Inactive (10/04/2023)   Exercise Vital Sign    Days of Exercise per Week: 0 days    Minutes of Exercise per Session: 0 min  Stress: No Stress Concern Present (10/04/2023)   [employer] of Occupational Health - Occupational Stress Questionnaire    Feeling of Stress : Not at all  Social Connections: Moderately Integrated (10/04/2023)   Social Connection and Isolation Panel [NHANES]    Frequency of Communication with Friends and Family: More than three times a week    Frequency of Social Gatherings with Friends and Family: Once a week    Attends Religious Services: More than 4 times per year    Active Member of [employer] or Organizations: No    Attends Banker Meetings: Never    Marital Status: Married  Catering manager  Violence: Not At Risk (10/04/2023)   Humiliation, Afraid, Rape, and Kick questionnaire    Fear of Current or Ex-Partner: No    Emotionally Abused: No    Physically Abused: No    Sexually Abused: No    Outpatient Medications Prior to Visit  Medication Sig Dispense Refill   albuterol  (PROVENTIL ) (2.5 MG/3ML) 0.083% nebulizer solution Take 3 mLs (2.5 mg total) by nebulization every 6 (six) hours as needed. 75 mL 0   montelukast  (SINGULAIR ) 10 MG tablet Take 1 tablet (10 mg total) by mouth at bedtime. 90 tablet 0   olmesartan  (BENICAR ) 40 MG tablet Take 1 tablet (40 mg total) by mouth daily. 90 tablet 0   pregabalin  (LYRICA ) 75 MG capsule Take 1 capsule (75 mg total) by mouth 2 (two) times daily. 60 capsule 2   propranolol  (INDERAL ) 20 MG tablet Take 1 tablet (20 mg total) by mouth 2 (two) times daily. 60 tablet 2   budesonide -formoterol  (SYMBICORT ) 80-4.5 MCG/ACT inhaler Inhale 2 puffs into the lungs 2 (two) times daily. (Patient not taking: Reported on 10/26/2023) 1 each 12   No facility-administered medications prior to visit.    No Known Allergies  Review of Systems  Constitutional:  Negative for chills, fatigue and fever.  HENT:  Positive for ear pain. Negative for congestion and sinus pain.   Respiratory:  Negative for cough and shortness of breath.   Cardiovascular:  Negative for chest pain.  Gastrointestinal:  Negative for abdominal pain, constipation, diarrhea, nausea and vomiting.  Musculoskeletal:  Negative for myalgias.  Neurological:  Negative for headaches.       Objective:        10/26/2023    8:21 AM 10/04/2023    7:49 AM 05/25/2023   11:32 AM  Vitals with BMI  Height 5\' 2"  5\' 2"    Weight 148 lbs 13 oz 144 lbs 13 oz   BMI 27.21 26.48   Systolic 110 110 865  Diastolic 80 78 68  Pulse 64 65     No data found.   Physical Exam Vitals and nursing note reviewed.  Constitutional:      Appearance: Normal appearance.  HENT:     Head: Normocephalic and atraumatic.      Ears:     Comments: HEENT: Cerumen impaction in left ear, eardrum not visible. Right ear appears abnormal. Narrow ear canals bilaterally. Cardiovascular:     Rate and Rhythm: Normal rate and regular rhythm.  Pulmonary:     Effort: Pulmonary effort is normal.     Breath sounds: Normal breath sounds.  Neurological:     Mental Status: She is alert.  Procedure: Cerumen removal Description: The curette was used to remove cerumen from the right ear. Narrow ear canals observed. A wall of wax was noted behind the initial removal site. Eardrum visibility was obstructed by remaining wax. Pain reported during the procedure.   Health Maintenance Due  Topic Date Due   Hepatitis C Screening  Never done    There are no preventive care reminders to display for this patient.   Lab Results  Component Value Date   TSH 0.756 12/14/2020   Lab Results  Component Value Date   WBC 13.8 (H) 12/15/2020   HGB 13.7 12/15/2020   HCT 39.1 12/15/2020   MCV 81.1 12/15/2020   PLT 343 12/15/2020   Lab Results  Component Value Date   NA 135 12/15/2020   K 4.0 12/15/2020   CO2 25 12/15/2020   GLUCOSE 115 (H) 12/15/2020   BUN 15 12/15/2020   CREATININE 1.01 (H) 12/15/2020   BILITOT 0.4 12/14/2020   ALKPHOS 88 12/14/2020   AST 11 12/14/2020   ALT 16 12/14/2020   PROT 7.1 12/14/2020   ALBUMIN 4.2 12/14/2020   CALCIUM 8.7 (L) 12/15/2020   ANIONGAP 10 12/15/2020   EGFR 90 12/14/2020   No results found for: "CHOL" No results found for: "HDL" No results found for: "LDLCALC" No results found for: "TRIG" No results found for: "CHOLHDL" No results found for: "HGBA1C"     Assessment & Plan:   Impacted cerumen of both ears Assessment & Plan: Impacted cerumen in both ears, with the left ear more affected. Narrow ear canals and earplug use at work may exacerbate wax buildup. - Flush both ears to remove impacted cerumen. - Advise continued use of Mirawax drops biweekly to prevent future wax  buildup.    Right ear pain Assessment & Plan: Right ear pain and possible infection Right ear pain with suspected bacterial infection, characterized by pain, itching, and fullness. A fishy smell and appearance suggest infection. Significant cerumen buildup may contribute to symptoms. - Flush both ears to remove impacted cerumen for better visualization of the eardrum. - If infection signs persist post-flushing, prescribe an antibiotic for the right ear.                  Follow-up: Return if symptoms worsen or fail to improve.  An After Visit Summary was printed and given to the patient.  Rilee Wendling, MD [facility] [phone]

## 2023-10-26 NOTE — Patient Instructions (Signed)
 Apply DEBROX ear drops every 2-3 weeks in both the ears to help clear the wax.  Return as needed

## 2023-10-29 NOTE — Assessment & Plan Note (Signed)
 Impacted cerumen in both ears, with the left ear more affected. Narrow ear canals and earplug use at work may exacerbate wax buildup. - Flush both ears to remove impacted cerumen. - Advise continued use of Mirawax drops biweekly to prevent future wax buildup.

## 2023-10-29 NOTE — Assessment & Plan Note (Signed)
 Right ear pain and possible infection Right ear pain with suspected bacterial infection, characterized by pain, itching, and fullness. A fishy smell and appearance suggest infection. Significant cerumen buildup may contribute to symptoms. - Flush both ears to remove impacted cerumen for better visualization of the eardrum. - If infection signs persist post-flushing, prescribe an antibiotic for the right ear.

## 2023-10-31 ENCOUNTER — Telehealth: Payer: Self-pay

## 2023-10-31 NOTE — Telephone Encounter (Signed)
 Copied from CRM [id number]. Topic: Clinical - Medical Advice >> Oct 31, 2023 12:47 PM Sophia H wrote: Reason for CRM: Pt stated she has been having issues with her GERD, wants to know what she can take to help ease the symptoms that she's having waking up during the night, gets a bad cough as well & omeprazole is not helping.   Best contact # [phone]

## 2023-11-01 ENCOUNTER — Other Ambulatory Visit: Payer: Self-pay

## 2023-11-01 MED ORDER — PANTOPRAZOLE SODIUM 40 MG PO TBEC: 40.0000 mg | DELAYED_RELEASE_TABLET | Freq: Every day | ORAL | 3 refills | Status: DC

## 2023-11-19 ENCOUNTER — Ambulatory Visit

## 2023-11-19 ENCOUNTER — Other Ambulatory Visit: Payer: Self-pay

## 2023-11-19 ENCOUNTER — Ambulatory Visit: Payer: Self-pay

## 2023-11-19 VITALS — BP 118/76 | HR 68 | Temp 98.0°F | Ht 62.0 in | Wt 151.2 lb

## 2023-11-19 DIAGNOSIS — M797 Fibromyalgia: Secondary | ICD-10-CM | POA: Diagnosis not present

## 2023-11-19 DIAGNOSIS — I10 Essential (primary) hypertension: Secondary | ICD-10-CM | POA: Diagnosis not present

## 2023-11-19 DIAGNOSIS — M2560 Stiffness of unspecified joint, not elsewhere classified: Secondary | ICD-10-CM | POA: Diagnosis not present

## 2023-11-19 DIAGNOSIS — G8929 Other chronic pain: Secondary | ICD-10-CM

## 2023-11-19 DIAGNOSIS — M7918 Myalgia, other site: Secondary | ICD-10-CM | POA: Diagnosis not present

## 2023-11-19 DIAGNOSIS — R5383 Other fatigue: Secondary | ICD-10-CM | POA: Diagnosis not present

## 2023-11-19 MED ORDER — METHOCARBAMOL 750 MG PO TABS: 750.0000 mg | ORAL_TABLET | Freq: Two times a day (BID) | ORAL | 0 refills | Status: AC | PRN

## 2023-11-19 NOTE — Patient Instructions (Signed)
  VISIT SUMMARY:  Today, we discussed your worsening chronic pain and fatigue related to fibromyalgia. We reviewed your current medications and symptoms, and we have made some adjustments to your treatment plan to help manage your pain and improve your quality of life.  YOUR PLAN:  FIBROMYALGIA: You have chronic widespread pain with severe flare-ups, muscle spasms, stiffness, and sensory overload. Your current medication, Lyrica , provides partial relief. -Continue taking Lyrica  (pregabalin ) 75 mg twice daily. -We will order blood work to rule out inflammatory arthritis and other conditions. -I am prescribing methocarbamol  (Robaxin ) to be taken in the evening as needed for stiffness or tightness. -Finish your current supply of meloxicam as needed for pain relief. -we could  have you try physical therapy for focused exercises to help manage your pain and improve your quality of life. let me know if you want to be referred.   CHRONIC PAIN: Your persistent pain affects your back, hips, and arms, and is associated with fibromyalgia. Current medications provide partial relief. -Continue your current pain management regimen with Lyrica  and meloxicam. -Use methocarbamol  as needed for muscle relaxation. -Engage in physical therapy for targeted exercises to manage your pain.  SLEEP DISTURBANCE: You experience excessive sleepiness despite adequate sleep, sometimes sleeping 15-20 hours after severe pain episodes. This may be related to fibromyalgia and chronic pain. -Monitor your sleep patterns and let us  know if sleep disturbances persist so we can consider further evaluation.  contains text generated by Abridge.   Contains text generated by Abridge.

## 2023-11-19 NOTE — Assessment & Plan Note (Signed)
 Persistent pain associated with fibromyalgia, affecting back, hips, and arms. Pain management is challenging, with current medications providing partial relief. Experiences more bad days than good days, impacting quality of life. Concerns about potential drowsiness with increased Lyrica  dosage. - Continue current pain management regimen with Lyrica  and meloxicam. - Utilize methocarbamol  as needed for muscle relaxation. - Engage in physical therapy for targeted exercises to manage pain. - ordered blood work to rule out other autoimmune/ rheumatological problems such as Lupus/ RA etc

## 2023-11-19 NOTE — Progress Notes (Signed)
 Acute Office Visit  Subjective:    Patient ID: Alison Simpson, female    DOB: 01/30/1971, 53 y.o.   MRN: [id number]  Chief Complaint  Patient presents with   Fibromyalgia    HPI: Patient is in today for  Discussed the use of AI scribe software for clinical note transcription with the patient, who gave verbal consent to proceed.  History of Present Illness   Alison Simpson is a 53 year old female with fibromyalgia who presents with worsening chronic pain and fatigue.  She experiences severe, widespread pain that has been progressively worsening, described as being all over her body. Recent episodes have been particularly intense, including muscle spasms and a sensation of pulled muscles in her stomach. The pain can be sharp, causing her to nearly fall, but is often a dull muscle ache. Her hands and arms are also affected, with severe pain making it difficult to bend her fingers.  She has been taking Lyrica  (pregabalin ) twice daily, which provides some relief, but she continues to experience significant pain. The pain never fully resolves, and she has more severe pain episodes at least twice a week. She also experiences 'fibro fog,' feeling disoriented and excessively sleepy despite sleeping through the night. After a bad episode, she may sleep for 15 to 20 hours.  She works a job with 12-hour shifts, which she hoped would allow for more rest days, but she finds herself constantly tired and in pain. She tries to stretch and move regularly at work to manage her symptoms. She describes a sensory overload, where even the touch of clothing can be unbearable.  She has not had recent blood work or imaging studies and is unsure about having sleep apnea. No snoring or stopping breathing at night. She experiences stiffness, particularly in her back and hips, and has not had recent x-rays. Her whole body feels stiff, and she often feels tired despite adequate sleep.  She has previously used meloxicam  for pain management and has tried muscle relaxants in the past, which provided some relief. She is part of a fibromyalgia support group and is actively seeking ways to manage her symptoms.      Past Medical History:  Diagnosis Date   Arm pain    Asthma    COVID-19 07/05/2020   Hypertension    Pregnancy induced hypertension    Tingling     Past Surgical History:  Procedure Laterality Date   TUBAL LIGATION     1999    Family History  Problem Relation Age of Onset   Cataracts Mother    Other Father        unsure of medical history   Diabetes Neg Hx    Hypertension Neg Hx    Coronary artery disease Neg Hx     Social History   Socioeconomic History   Marital status: Married    Spouse name: Not on file   Number of children: 1   Years of education: some college   Highest education level: GED or equivalent  Occupational History   Occupation: works w/ [employer] health day [employer]     Employer: [employer]  Tobacco Use   Smoking status: Never   Smokeless tobacco: Never  Substance and Sexual Activity   Alcohol use: No   Drug use: No   Sexual activity: Yes    Partners: Male  Other Topics Concern   Not on file  Social History Narrative   Married, 1 biological child, 2 adopted  children.   Employed full time. Walks 20 min twice/week.    Right-handed.   1-5 glasses of soft drinks per day.   Social Drivers of Corporate investment banker Strain: Low Risk  (10/04/2023)   Overall Financial Resource Strain (CARDIA)    Difficulty of Paying Living Expenses: Not hard at all  Food Insecurity: No Food Insecurity (10/04/2023)   Hunger Vital Sign    Worried About Running Out of Food in the Last Year: Never true    Ran Out of Food in the Last Year: Never true  Transportation Needs: No Transportation Needs (10/04/2023)   PRAPARE - Administrator, Civil Service (Medical): No    Lack of Transportation (Non-Medical): No  Physical Activity: Inactive (10/04/2023)   Exercise Vital  Sign    Days of Exercise per Week: 0 days    Minutes of Exercise per Session: 0 min  Stress: No Stress Concern Present (10/04/2023)   [employer] of Occupational Health - Occupational Stress Questionnaire    Feeling of Stress : Not at all  Social Connections: Moderately Integrated (10/04/2023)   Social Connection and Isolation Panel [NHANES]    Frequency of Communication with Friends and Family: More than three times a week    Frequency of Social Gatherings with Friends and Family: Once a week    Attends Religious Services: More than 4 times per year    Active Member of [employer] or Organizations: No    Attends Banker Meetings: Never    Marital Status: Married  Catering manager Violence: Not At Risk (10/04/2023)   Humiliation, Afraid, Rape, and Kick questionnaire    Fear of Current or Ex-Partner: No    Emotionally Abused: No    Physically Abused: No    Sexually Abused: No    Outpatient Medications Prior to Visit  Medication Sig Dispense Refill   albuterol  (PROVENTIL ) (2.5 MG/3ML) 0.083% nebulizer solution Take 3 mLs (2.5 mg total) by nebulization every 6 (six) hours as needed. 75 mL 0   montelukast  (SINGULAIR ) 10 MG tablet Take 1 tablet (10 mg total) by mouth at bedtime. 90 tablet 0   olmesartan  (BENICAR ) 40 MG tablet Take 1 tablet (40 mg total) by mouth daily. 90 tablet 0   pantoprazole  (PROTONIX ) 40 MG tablet Take 1 tablet (40 mg total) by mouth daily. 30 tablet 3   pregabalin  (LYRICA ) 75 MG capsule Take 1 capsule (75 mg total) by mouth 2 (two) times daily. 60 capsule 2   propranolol  (INDERAL ) 20 MG tablet Take 1 tablet (20 mg total) by mouth 2 (two) times daily. 60 tablet 2   No facility-administered medications prior to visit.    No Known Allergies  Review of Systems  Constitutional:  Positive for fatigue. Negative for chills and fever.  HENT:  Negative for congestion, ear pain and sinus pain.   Respiratory:  Negative for cough and shortness of breath.    Cardiovascular:  Negative for chest pain.  Gastrointestinal:  Negative for abdominal pain, constipation, diarrhea, nausea and vomiting.  Musculoskeletal:  Positive for arthralgias and myalgias. Negative for joint swelling.  Neurological:  Negative for headaches.  Psychiatric/Behavioral:  Positive for sleep disturbance.        Objective:         11/19/2023   10:24 AM 10/26/2023    8:21 AM 10/04/2023    7:49 AM  Vitals with BMI  Height 5\' 2"  5\' 2"  5\' 2"   Weight 151 lbs 3 oz 148 lbs 13  oz 144 lbs 13 oz  BMI 27.65 27.21 26.48  Systolic 118 110 161  Diastolic 76 80 78  Pulse 68 64 65    No data found.   Physical Exam Vitals and nursing note reviewed.  Constitutional:      Appearance: Normal appearance.  HENT:     Head: Normocephalic and atraumatic.  Cardiovascular:     Rate and Rhythm: Normal rate and regular rhythm.  Pulmonary:     Effort: Pulmonary effort is normal.     Breath sounds: Normal breath sounds.  Musculoskeletal:        General: Tenderness (cervical, thoracic spine, paraspinal region, lumbar spine) present.  Skin:    General: Skin is warm.  Neurological:     General: No focal deficit present.     Mental Status: She is alert.  Psychiatric:        Mood and Affect: Mood normal.     Health Maintenance Due  Topic Date Due   Hepatitis C Screening  Never done    There are no preventive care reminders to display for this patient.   Lab Results  Component Value Date   TSH 0.756 12/14/2020   Lab Results  Component Value Date   WBC 13.8 (H) 12/15/2020   HGB 13.7 12/15/2020   HCT 39.1 12/15/2020   MCV 81.1 12/15/2020   PLT 343 12/15/2020   Lab Results  Component Value Date   NA 135 12/15/2020   K 4.0 12/15/2020   CO2 25 12/15/2020   GLUCOSE 115 (H) 12/15/2020   BUN 15 12/15/2020   CREATININE 1.01 (H) 12/15/2020   BILITOT 0.4 12/14/2020   ALKPHOS 88 12/14/2020   AST 11 12/14/2020   ALT 16 12/14/2020   PROT 7.1 12/14/2020   ALBUMIN 4.2  12/14/2020   CALCIUM 8.7 (L) 12/15/2020   ANIONGAP 10 12/15/2020   EGFR 109.0 09/23/2023   No results found for: "CHOL" No results found for: "HDL" No results found for: "LDLCALC" No results found for: "TRIG" No results found for: "CHOLHDL" No results found for: "HGBA1C"     Assessment & Plan:  Fibromyalgia Assessment & Plan: Chronic widespread pain with episodes of severe flare-ups, described as dull and aching with occasional sharp spasms. Symptoms include muscle spasms, stiffness, and sensory overload, affecting daily functioning. Reports severe pain episodes at least twice a week. Current medication is Lyrica  (pregabalin ) 75 mg twice daily, providing partial relief. Concerns about increasing Lyrica  dose due to potential drowsiness. Differential diagnosis includes inflammatory arthritis such as rheumatoid arthritis, requiring blood work for exclusion. - Order blood work to rule out inflammatory arthritis and other conditions. - Continue Lyrica  (pregabalin ) 75 mg twice daily. - Prescribe methocarbamol  (Robaxin ) as a muscle relaxant to be taken in the evening as needed for stiffness or tightness. - Finish current supply of meloxicam as needed for pain relief. - advised that I could refer to physical therapy for focused exercises to improve pain management and quality of life. She is not sure if her insurance covers her visits, she will check with them and get back to me    Excessive sleepiness despite adequate sleep duration, sometimes sleeping 15-20 hours after severe pain episodes. No known sleep apnea, but excessive sleepiness may be related to fibromyalgia and chronic pain. No snoring or observed apneas reported. - Monitor sleep patterns and consider further evaluation if sleep disturbances persist.      Orders: -     CBC with Differential/Platelet -     Comprehensive  metabolic panel with GFR -     Sedimentation rate -     Magnesium -     Rheumatoid factor -     ANA  w/Reflex  Musculoskeletal pain, chronic Assessment & Plan: Persistent pain associated with fibromyalgia, affecting back, hips, and arms. Pain management is challenging, with current medications providing partial relief. Experiences more bad days than good days, impacting quality of life. Concerns about potential drowsiness with increased Lyrica  dosage. - Continue current pain management regimen with Lyrica  and meloxicam. - Utilize methocarbamol  as needed for muscle relaxation. - Engage in physical therapy for targeted exercises to manage pain. - ordered blood work to rule out other autoimmune/ rheumatological problems such as Lupus/ RA etc  Orders: -     CBC with Differential/Platelet -     Comprehensive metabolic panel with GFR -     Sedimentation rate -     Magnesium -     Rheumatoid factor -     ANA w/Reflex  Benign essential HTN -     CBC with Differential/Platelet -     Comprehensive metabolic panel with GFR -     Sedimentation rate -     Magnesium -     Rheumatoid factor -     ANA w/Reflex  Morning stiffness of joints -     CBC with Differential/Platelet -     Comprehensive metabolic panel with GFR -     Sedimentation rate -     Magnesium -     Rheumatoid factor -     ANA w/Reflex  Other fatigue -     B12 and Folate Panel -     T4, free -     TSH  Other orders -     Methocarbamol ; Take 1 tablet (750 mg total) by mouth 2 (two) times daily as needed for muscle spasms.  Dispense: 60 tablet; Refill: 0    Assessment and Plan           Meds ordered this encounter  Medications   methocarbamol  (ROBAXIN ) 750 MG tablet    Sig: Take 1 tablet (750 mg total) by mouth 2 (two) times daily as needed for muscle spasms.    Dispense:  60 tablet    Refill:  0    Orders Placed This Encounter  Procedures   CBC with Differential/Platelet   Comprehensive metabolic panel with GFR   Sedimentation rate   Magnesium   Rheumatoid factor   ANA w/Reflex   B12 and Folate Panel    T4, free   TSH     Follow-up: Return in about 3 months (around 02/19/2024) for chronic disease follow up.  An After Visit Summary was printed and given to the patient.  Adaliah Hiegel, MD [facility] [phone]

## 2023-11-19 NOTE — Telephone Encounter (Signed)
  Chief Complaint: Severe fibromyalgia exacerbation Symptoms: severe muscle pain all over body except head Frequency: yesterday was a severe flare up, occurrences increasing in frequency Pertinent Negatives: Patient denies chest pressure, difficulty breathing Disposition: [] ED /[] Urgent Care (no appt availability in office) / [x] Appointment(In office/virtual)/ []  [facility]/ [] Home Care/ [] Refused Recommended Disposition /[] [facility]/ []  Follow-up with PCP Additional Notes: Patient called in looking for advice or evaluation on exacerbation of fibromyalgia. Patient states yesterday she had a flare up that was so severe she felt like it was unbearable and "she was going to hit the floor". Patient states when the flare-ups occur, she also gets sleepy, dazed, and confused. Patient looking to be evaluated by doctor and discuss further diagnostics, evaluation, and treatment. Appt made for today.   Copied from CRM [id number]. Topic: Clinical - Red Word Triage >> Nov 19, 2023  7:48 AM Oddis Bench wrote: Red Word that prompted transfer to Nurse Triage: Patient is stating that she is still having severe muscle pain. She was told that she has fibromialga Reason for Disposition  [1] SEVERE pain (e.g., excruciating, unable to do any normal activities) AND [2] not improved 2 hours after pain medicine  Answer Assessment - Initial Assessment Questions 1. ONSET: "When did the muscle aches or body pains start?"      Having 2-3 a week now, yesterday was the worst 2. LOCATION: "What part of your body is hurting?" (e.g., entire body, arms, legs)      All muscles on body hurt yesterday except on head 3. SEVERITY: "How bad is the pain?" (Scale 1-10; or mild, moderate, severe)   - MILD (1-3): doesn't interfere with normal activities    - MODERATE (4-7): interferes with normal activities or awakens from sleep    - SEVERE (8-10):  excruciating pain, unable to do any normal activities      Patient  states yesterday it was so severe she felt like she was going to hit the floor 4. CAUSE: "What do you think is causing the pains?"     Patient states she was diagnosed with fibromyalgia 5. FEVER: "Have you been having fever?"     No 6. OTHER SYMPTOMS: "Do you have any other symptoms?" (e.g., chest pain, weakness, rash, cold or flu symptoms, weight loss)     No  Protocols used: Muscle Aches and Body Pain-A-AH

## 2023-11-19 NOTE — Assessment & Plan Note (Addendum)
 Chronic widespread pain with episodes of severe flare-ups, described as dull and aching with occasional sharp spasms. Symptoms include muscle spasms, stiffness, and sensory overload, affecting daily functioning. Reports severe pain episodes at least twice a week. Current medication is Lyrica  (pregabalin ) 75 mg twice daily, providing partial relief. Concerns about increasing Lyrica  dose due to potential drowsiness. Differential diagnosis includes inflammatory arthritis such as rheumatoid arthritis, requiring blood work for exclusion. - Order blood work to rule out inflammatory arthritis and other conditions. - Continue Lyrica  (pregabalin ) 75 mg twice daily. - Prescribe methocarbamol  (Robaxin ) as a muscle relaxant to be taken in the evening as needed for stiffness or tightness. - Finish current supply of meloxicam as needed for pain relief. - advised that I could refer to physical therapy for focused exercises to improve pain management and quality of life. She is not sure if her insurance covers her visits, she will check with them and get back to me    Excessive sleepiness despite adequate sleep duration, sometimes sleeping 15-20 hours after severe pain episodes. No known sleep apnea, but excessive sleepiness may be related to fibromyalgia and chronic pain. No snoring or observed apneas reported. - Monitor sleep patterns and consider further evaluation if sleep disturbances persist.

## 2023-11-20 ENCOUNTER — Ambulatory Visit: Payer: Self-pay

## 2023-11-20 LAB — B12 AND FOLATE PANEL
Folate: >20 ng/mL (ref >3.0)
Vitamin B-12: 629 pg/mL (ref 232–1245)

## 2023-11-20 LAB — T4, FREE: Free T4: 1.25 ng/dL (ref 0.82–1.77)

## 2023-11-20 LAB — TSH: TSH: 1.02 u[IU]/mL (ref 0.450–4.500)

## 2023-11-23 ENCOUNTER — Ambulatory Visit (HOSPITAL_BASED_OUTPATIENT_CLINIC_OR_DEPARTMENT_OTHER)
Admission: RE | Admit: 2023-11-23 | Discharge: 2023-11-23 | Disposition: A | Discharge status: Home / Self Care | Source: Ambulatory Visit | Attending: Family Medicine | Admitting: Family Medicine

## 2023-11-23 ENCOUNTER — Ambulatory Visit: Payer: Self-pay

## 2023-11-23 ENCOUNTER — Ambulatory Visit: Admitting: Family Medicine

## 2023-11-23 ENCOUNTER — Encounter: Payer: Self-pay | Admitting: Family Medicine

## 2023-11-23 VITALS — BP 128/78 | HR 86 | Temp 98.1°F | Ht 62.0 in | Wt 148.0 lb

## 2023-11-23 DIAGNOSIS — S9781XA Crushing injury of right foot, initial encounter: Secondary | ICD-10-CM | POA: Diagnosis not present

## 2023-11-23 DIAGNOSIS — S99921A Unspecified injury of right foot, initial encounter: Secondary | ICD-10-CM

## 2023-11-23 DIAGNOSIS — Z043 Encounter for examination and observation following other accident: Secondary | ICD-10-CM | POA: Diagnosis not present

## 2023-11-23 NOTE — Progress Notes (Signed)
 Acute Office Visit  Subjective:    Patient ID: Alison Simpson, female    DOB: 09-Mar-1971, 53 y.o.   MRN: [id number]  Chief Complaint  Patient presents with   Toe Pain   Per triage note: "Pt dropped a skid on her foot last night at work. Pt thought she had broken all her toes. Foot got better during shift, however when she started driving, pain became intense in right big toe. Pt also noticed a lump on the bottom of that toe."   Discussed the use of AI scribe software for clinical note transcription with the patient, who gave verbal consent to proceed.  History of Present Illness   Alison Simpson is a 53 year old female who presents with foot pain after dropping a pallet on her foot at work.  She accidentally dropped a large pallet on her right foot at work last night around 6 PM. Initially, she thought she might have broken her foot but did not seek immediate medical attention or get an x-ray. By the time she left work at 7 PM, she was able to walk on it, although her big toe was throbbing.  The pain is primarily located in the big toe, which is swollen and has developed a large blister on the bottom. The toe was more purple last night, and the pain radiates, causing numbness and aching in certain positions, especially when driving. The whole foot aches when held in a driving position, but it did not hurt while lying in bed last night.  She has not taken any anti-inflammatory medications but is on Lyrica . She attempted icing the foot this morning, which did not significantly alleviate the pain. She is concerned about the possibility of a broken bone or a blood vessel injury, as the bottom of the toe is swollen and sore.  She works on [address] and needs to report the injury within 24 hours. She has not missed work yet and has not filed an incident report with work either. No worsening of pain with ice application, although it caused some discomfort in the toe. No fever or other systemic  symptoms.      Past Medical History:  Diagnosis Date   Arm pain    Asthma    COVID-19 07/05/2020   Hypertension    Pregnancy induced hypertension    Tingling     Past Surgical History:  Procedure Laterality Date   TUBAL LIGATION     1999    Family History  Problem Relation Age of Onset   Cataracts Mother    Other Father        unsure of medical history   Diabetes Neg Hx    Hypertension Neg Hx    Coronary artery disease Neg Hx     Social History   Socioeconomic History   Marital status: Married    Spouse name: Not on file   Number of children: 1   Years of education: some college   Highest education level: GED or equivalent  Occupational History   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  Employed full time. Walks 20 min twice/week.    Right-handed.   1-5 glasses of soft drinks per day.   Social Drivers of Corporate investment banker Strain: Low Risk  (10/04/2023)   Overall Financial Resource Strain (CARDIA)    Difficulty of Paying Living Expenses: Not hard at all  Food Insecurity: No Food Insecurity (10/04/2023)   Hunger Vital Sign    Worried About Running Out of Food in the Last Year: Never true    Ran Out of Food in the Last Year: Never true  Transportation Needs: No Transportation Needs (10/04/2023)   PRAPARE - Administrator, Civil Service (Medical): No    Lack of Transportation (Non-Medical): No  Physical Activity: Inactive (10/04/2023)   Exercise Vital Sign    Days of Exercise per Week: 0 days    Minutes of Exercise per Session: 0 min  Stress: No Stress Concern Present (10/04/2023)   [employer] of Occupational Health -  Occupational Stress Questionnaire    Feeling of Stress : Not at all  Social Connections: Moderately Integrated (10/04/2023)   Social Connection and Isolation Panel [NHANES]    Frequency of Communication with Friends and Family: More than three times a week    Frequency of Social Gatherings with Friends and Family: Once a week    Attends Religious Services: More than 4 times per year    Active Member of [employer] or Organizations: No    Attends Banker Meetings: Never    Marital Status: Married  Catering manager Violence: Not At Risk (10/04/2023)   Humiliation, Afraid, Rape, and Kick questionnaire    Fear of Current or Ex-Partner: No    Emotionally Abused: No    Physically Abused: No    Sexually Abused: No    Outpatient Medications Prior to Visit  Medication Sig Dispense Refill   albuterol  (PROVENTIL ) (2.5 MG/3ML) 0.083% nebulizer solution Take 3 mLs (2.5 mg total) by nebulization every 6 (six) hours as needed. 75 mL 0   methocarbamol  (ROBAXIN ) 750 MG tablet Take 1 tablet (750 mg total) by mouth 2 (two) times daily as needed for muscle spasms. 60 tablet 0   montelukast  (SINGULAIR ) 10 MG tablet Take 1 tablet (10 mg total) by mouth at bedtime. 90 tablet 0   olmesartan  (BENICAR ) 40 MG tablet Take 1 tablet (40 mg total) by mouth daily. 90 tablet 0   pantoprazole  (PROTONIX ) 40 MG tablet Take 1 tablet (40 mg total) by mouth daily. 30 tablet 3   pregabalin  (LYRICA ) 75 MG capsule Take 1 capsule (75 mg total) by mouth 2 (two) times daily. 60 capsule 2   propranolol  (INDERAL ) 20 MG tablet Take 1 tablet (20 mg total) by mouth 2 (two) times daily. 60 tablet 2   No facility-administered medications prior to visit.    No Known Allergies  Review of Systems  Constitutional:  Negative for chills, fatigue and fever.  HENT:  Negative for congestion, ear pain, rhinorrhea and sore throat.   Respiratory:  Negative for cough and shortness of breath.   Cardiovascular:  Negative for chest pain.   Gastrointestinal:  Negative for abdominal pain, constipation, diarrhea, nausea and vomiting.  Genitourinary:  Negative for dysuria and urgency.  Musculoskeletal:  Negative for back pain and myalgias.       Right foot pain  Neurological:  Negative for dizziness, weakness, light-headedness and headaches.  Psychiatric/Behavioral:  Negative for dysphoric mood. The patient is not nervous/anxious.        Objective:  11/23/2023   10:36 AM 11/19/2023   10:24 AM 10/26/2023    8:21 AM  Vitals with BMI  Height 5\' 2"  5\' 2"  5\' 2"   Weight 148 lbs 151 lbs 3 oz 148 lbs 13 oz  BMI 27.06 27.65 27.21  Systolic 128 118 161  Diastolic 78 76 80  Pulse 86 68 64    No data found.   Physical Exam Vitals reviewed.  Constitutional:      General: She is not in acute distress.    Appearance: Normal appearance.  Eyes:     Conjunctiva/sclera: Conjunctivae normal.  Cardiovascular:     Rate and Rhythm: Normal rate and regular rhythm.     Heart sounds: Normal heart sounds. No murmur heard. Pulmonary:     Effort: Pulmonary effort is normal.     Breath sounds: Normal breath sounds. No wheezing.  Musculoskeletal:        General: Swelling and signs of injury present.  Skin:    General: Skin is warm.  Neurological:     Mental Status: She is alert. Mental status is at baseline.  Psychiatric:        Mood and Affect: Mood normal.        Behavior: Behavior normal.        Health Maintenance Due  Topic Date Due   Hepatitis C Screening  Never done    There are no preventive care reminders to display for this patient.   Lab Results  Component Value Date   TSH 1.020 11/19/2023   Lab Results  Component Value Date   WBC 13.8 (H) 12/15/2020   HGB 13.7 12/15/2020   HCT 39.1 12/15/2020   MCV 81.1 12/15/2020   PLT 343 12/15/2020   Lab Results  Component Value Date   NA 135 12/15/2020   K 4.0 12/15/2020   CO2 25 12/15/2020   GLUCOSE 115 (H) 12/15/2020   BUN 15 12/15/2020    CREATININE 1.01 (H) 12/15/2020   BILITOT 0.4 12/14/2020   ALKPHOS 88 12/14/2020   AST 11 12/14/2020   ALT 16 12/14/2020   PROT 7.1 12/14/2020   ALBUMIN 4.2 12/14/2020   CALCIUM 8.7 (L) 12/15/2020   ANIONGAP 10 12/15/2020   EGFR 109.0 09/23/2023   No results found for: "CHOL" No results found for: "HDL" No results found for: "LDLCALC" No results found for: "TRIG" No results found for: "CHOLHDL" No results found for: "HGBA1C"     Assessment & Plan:  Injury of right foot, initial encounter Assessment & Plan: Acute right foot injury with significant swelling and blistering. Differential includes fracture or vascular injury. Pain is position-dependent with numbness and aching radiating up the leg. - Order stat x-ray of the right foot to assess for fracture. - Advise RICE method: Rest, Ice, Compression, Elevation. - Discuss potential need for stabilization with a boot or brace if fracture is confirmed. - Provide work accommodations if fracture is confirmed and paperwork is needed. - Instruct [facility] to [facility].   Orders: -     DG Foot Complete Right; Future     No orders of the defined types were placed in this encounter.      Orders Placed This Encounter  Procedures   DG Foot Complete Right     Follow-up: Return if symptoms worsen or fail to improve.  An After Visit Summary was printed and given to the patient.  Delford Felling, FNP [facility] [phone]

## 2023-11-23 NOTE — Telephone Encounter (Signed)
  Chief Complaint: Injured foot/toe - right foot Symptoms: pain - bump on bottom of toes Frequency: last night Pertinent Negatives: Patient denies  Disposition: [] ED /[] Urgent Care (no appt availability in office) / [x] Appointment(In office/virtual)/ []  [place]/ [] Home Care/ [] Refused Recommended Disposition /[] [place]/ []  Follow-up with PCP Additional Notes: Pt dropped a skid on her foot last night at work. Pt thought she had broken all her toes. Foot got better during shift, however when she started driving, pain became intense in right big toe. Pt also noticed a lump on the bottom of that toe. Appt this morning in office.     Copied from CRM [id number]. Topic: Clinical - Red Word Triage >> Nov 23, 2023  9:53 AM Chrystal Crape R wrote: Pt calling to find out what she should do, she believes she broke her toe, it is swollen and theres a blister on the  bottom. Reason for Disposition  [1] SEVERE pain AND [2] not improved 2 hours after pain medicine/ice packs  Answer Assessment - Initial Assessment Questions 1. MECHANISM: "How did the injury happen?"      Dropped skid on foot. 2. ONSET: "When did the injury happen?" (Minutes or hours ago)      Last night 3. LOCATION: "What part of the toe is injured?" "Is the nail damaged?"      yes 4. APPEARANCE of TOE INJURY: "What does the injury look like?"      Bump on bottom 5. SEVERITY: "Can you use the foot normally?" "Can you walk?"      Yes - hurts to drive  7. PAIN: "Is there pain?" If Yes, ask: "How bad is the pain?"   (e.g., Scale 1-10; or mild, moderate, severe)     yes  Protocols used: Toe Injury-A-AH

## 2023-11-23 NOTE — Assessment & Plan Note (Signed)
 Acute right foot injury with significant swelling and blistering. Differential includes fracture or vascular injury. Pain is position-dependent with numbness and aching radiating up the leg. - Order stat x-ray of the right foot to assess for fracture. - Advise RICE method: Rest, Ice, Compression, Elevation. - Discuss potential need for stabilization with a boot or brace if fracture is confirmed. - Provide work accommodations if fracture is confirmed and paperwork is needed. - Instruct her to go to [facility] for imaging.

## 2023-11-25 ENCOUNTER — Ambulatory Visit: Payer: Self-pay | Admitting: Family Medicine

## 2023-11-28 LAB — COMPREHENSIVE METABOLIC PANEL WITH GFR
ALT: 18 IU/L (ref 0–32)
AST: 14 IU/L (ref 0–40)
Albumin: 4.1 g/dL (ref 3.8–4.9)
Alkaline Phosphatase: 86 IU/L (ref 44–121)
BUN/Creatinine Ratio: 24 — ABNORMAL HIGH (ref 9–23)
BUN: 18 mg/dL (ref 6–24)
Bilirubin Total: 0.2 mg/dL (ref 0.0–1.2)
CO2: 22 mmol/L (ref 20–29)
Calcium: 9.1 mg/dL (ref 8.7–10.2)
Chloride: 107 mmol/L — ABNORMAL HIGH (ref 96–106)
Creatinine, Ser: 0.75 mg/dL (ref 0.57–1.00)
Globulin, Total: 2.7 g/dL (ref 1.5–4.5)
Glucose: 66 mg/dL — ABNORMAL LOW (ref 70–99)
Potassium: 4.6 mmol/L (ref 3.5–5.2)
Sodium: 143 mmol/L (ref 134–144)
Total Protein: 6.8 g/dL (ref 6.0–8.5)
eGFR: 96 mL/min/{1.73_m2} (ref >59)

## 2023-11-28 LAB — CBC WITH DIFFERENTIAL/PLATELET
Basophils Absolute: 0.1 10*3/uL (ref 0.0–0.2)
Basos: 1 %
EOS (ABSOLUTE): 0.2 10*3/uL (ref 0.0–0.4)
Eos: 3 %
Hematocrit: 38 % (ref 34.0–46.6)
Hemoglobin: 12.9 g/dL (ref 11.1–15.9)
Immature Grans (Abs): 0 10*3/uL (ref 0.0–0.1)
Immature Granulocytes: 0 %
Lymphocytes Absolute: 2.8 10*3/uL (ref 0.7–3.1)
Lymphs: 41 %
MCH: 28.6 pg (ref 26.6–33.0)
MCHC: 33.9 g/dL (ref 31.5–35.7)
MCV: 84 fL (ref 79–97)
Monocytes Absolute: 0.8 10*3/uL (ref 0.1–0.9)
Monocytes: 11 %
Neutrophils Absolute: 3 10*3/uL (ref 1.4–7.0)
Neutrophils: 44 %
Platelets: 302 10*3/uL (ref 150–450)
RBC: 4.51 x10E6/uL (ref 3.77–5.28)
RDW: 14.1 % (ref 11.7–15.4)
WBC: 6.9 10*3/uL (ref 3.4–10.8)

## 2023-11-28 LAB — SEDIMENTATION RATE: Sed Rate: 18 mm/h (ref 0–40)

## 2023-11-28 LAB — RHEUMATOID FACTOR: Rheumatoid fact SerPl-aCnc: 10.2 [IU]/mL (ref <14.0)

## 2023-11-28 LAB — ANA W/REFLEX: ANA Titer 1: NEGATIVE

## 2023-11-28 LAB — MAGNESIUM: Magnesium: 2.5 mg/dL — ABNORMAL HIGH (ref 1.6–2.3)

## 2023-12-17 ENCOUNTER — Ambulatory Visit

## 2023-12-17 ENCOUNTER — Ambulatory Visit: Payer: Self-pay

## 2023-12-17 VITALS — BP 132/82 | HR 62 | Temp 97.8°F | Ht 62.0 in | Wt 149.4 lb

## 2023-12-17 DIAGNOSIS — M25512 Pain in left shoulder: Secondary | ICD-10-CM | POA: Diagnosis not present

## 2023-12-17 MED ORDER — MELOXICAM 15 MG PO TABS: 15.0000 mg | ORAL_TABLET | Freq: Every day | ORAL | 0 refills | Status: DC | PRN

## 2023-12-17 NOTE — Patient Instructions (Signed)
  VISIT SUMMARY: You visited us  today due to persistent left shoulder and back pain following a fall two weeks ago. We discussed your symptoms, including the severe nighttime pain and the impact on your daily activities, and developed a plan to help manage your pain and improve your condition.  YOUR PLAN: LEFT SHOULDER PAIN: You have severe left shoulder pain following a fall, with tenderness over the left scapula and deltoid. The pain is particularly bad at night and worsens with certain movements, suggesting a possible rotator cuff issue. -Take the prescribed anti-inflammatory medication once daily in the morning. -Continue taking your muscle relaxant at bedtime. -Attend physical therapy sessions at [facility] in [place]. -If there is no improvement after 3-4 weeks, we may refer you to an orthopedist.  BACK PAIN: Your chronic back pain has worsened since the fall, especially when standing for long periods at work. There is tenderness in your thoracic spine. -Attend physical therapy sessions to focus on core strengthening exercises to prevent further injury.  GENERAL HEALTH MAINTENANCE: We discussed ways to prevent falls at home by ensuring rooms are well-lit and removing potential tripping hazards. -Keep your home well-lit and remove loose sheets or other items that could cause you to trip.                      Contains text generated by Abridge.                                 Contains text generated by [employer]bridge.

## 2023-12-17 NOTE — Assessment & Plan Note (Signed)
 Severe left shoulder pain following a fall two weeks ago, with tenderness over the left scapula and deltoid. Pain is excruciating at night and worsens with certain movements, suggesting possible rotator cuff involvement. No improvement with muscle relaxants. Suspected strain or bruise; no x-rays needed at this time. Discussed anti-inflammatory medication and physical therapy for recovery. Consider orthopedist referral if no improvement in 3-4 weeks. - Prescribe anti-inflammatory medication MELOXICAM 15 MG TO TAKE  once daily in the morning - Continue muscle relaxant at bedtime - Refer to physical therapy at [facility] in [place] - Consider orthopedist referral if no improvement after 3-4 weeks  Back Pain Chronic back pain exacerbated by recent fall, particularly bothersome during prolonged standing at work. Thoracic spinal tenderness present. Emphasized core strengthening through physical therapy to prevent further injury. - Refer to physical therapy for core strengthening exercises  General Health Maintenance Advised on home safety to prevent falls, emphasizing well-lit rooms and removal of potential hazards. - Maintain well-lit rooms and remove loose sheets or other tripping hazards

## 2023-12-17 NOTE — Progress Notes (Signed)
 Acute Office Visit  Subjective:    Patient ID: Alison Simpson, female    DOB: December 10, 1970, 53 y.o.   MRN: [id number]  Chief Complaint  Patient presents with   Fall    HPI: Patient is in today for a fall that happened about 2 weeks ago. She states that she was getting up in the middle of the night and slipped on a hanger and her left shoulder hit to toilet and her back is also in pain. Patient has been taking her Robaxin  and has not felt relief.  Discussed the use of AI scribe software for clinical note transcription with the patient, who gave verbal consent to proceed.  History of Present Illness   ABEGAIL Simpson is a 53 year old female with fibromyalgia who presents with persistent left shoulder and back pain following a fall two weeks ago.  She has been experiencing persistent pain in her left shoulder and back following a fall approximately two weeks ago. The incident involved slipping on a coat hanger in the bathroom, resulting in her hitting her left shoulder on the toilet. The pain is particularly severe at night and is not alleviated by her current muscle relaxant medication. The discomfort is exacerbated by standing for extended periods at work, where she is on her feet for twelve hours.  The shoulder pain was present before the fall but has worsened since the incident. It radiates down her arm and is particularly severe at night. She has been taking a muscle relaxant every night, which helps with her back pain but not with the shoulder pain. She also experiences stomach discomfort due to the severity of the shoulder pain.  She has a history of fibromyalgia, which typically affects her lower back and sciatic area, but the current pain is more localized to the upper back and shoulder region. She is concerned about potentially having bruised or strained a muscle during the fall. She notes a small bruise on her abdomen from the fall, which is now fading.       Past Medical History:   Diagnosis Date   Arm pain    Asthma    COVID-19 07/05/2020   Hypertension    Pregnancy induced hypertension    Tingling     Past Surgical History:  Procedure Laterality Date   TUBAL LIGATION     1999    Family History  Problem Relation Age of Onset   Cataracts Mother    Other Father        unsure of medical history   Diabetes Neg Hx    Hypertension Neg Hx    Coronary artery disease Neg Hx     Social History   Socioeconomic History   Marital status: Married    Spouse name: Not on file   Number of children: 1   Years of education: some college   Highest education level: GED or equivalent  Occupational History   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   Employed full time. Walks 20 min twice/week.    Right-handed.   1-5 glasses of soft drinks per  day.   Social Drivers of Health   Financial Resource Strain: Low Risk  (10/04/2023)   Overall Financial Resource Strain (CARDIA)    Difficulty of Paying Living Expenses: Not hard at all  Food Insecurity: No Food Insecurity (10/04/2023)   Hunger Vital Sign    Worried About Running Out of Food in the Last Year: Never true    Ran Out of Food in the Last Year: Never true  Transportation Needs: No Transportation Needs (10/04/2023)   PRAPARE - Administrator, Civil Service (Medical): No    Lack of Transportation (Non-Medical): No  Physical Activity: Inactive (10/04/2023)   Exercise Vital Sign    Days of Exercise per Week: 0 days    Minutes of Exercise per Session: 0 min  Stress: No Stress Concern Present (10/04/2023)   [employer] of Occupational Health - Occupational Stress Questionnaire     Feeling of Stress : Not at all  Social Connections: Moderately Integrated (10/04/2023)   Social Connection and Isolation Panel    Frequency of Communication with Friends and Family: More than three times a week    Frequency of Social Gatherings with Friends and Family: Once a week    Attends Religious Services: More than 4 times per year    Active Member of [employer] or Organizations: No    Attends Banker Meetings: Never    Marital Status: Married  Catering manager Violence: Not At Risk (10/04/2023)   Humiliation, Afraid, Rape, and Kick questionnaire    Fear of Current or Ex-Partner: No    Emotionally Abused: No    Physically Abused: No    Sexually Abused: No    Outpatient Medications Prior to Visit  Medication Sig Dispense Refill   albuterol  (PROVENTIL ) (2.5 MG/3ML) 0.083% nebulizer solution Take 3 mLs (2.5 mg total) by nebulization every 6 (six) hours as needed. 75 mL 0   methocarbamol  (ROBAXIN ) 750 MG tablet Take 1 tablet (750 mg total) by mouth 2 (two) times daily as needed for muscle spasms. 60 tablet 0   montelukast  (SINGULAIR ) 10 MG tablet Take 1 tablet (10 mg total) by mouth at bedtime. 90 tablet 0   olmesartan  (BENICAR ) 40 MG tablet Take 1 tablet (40 mg total) by mouth daily. 90 tablet 0   pantoprazole  (PROTONIX ) 40 MG tablet Take 1 tablet (40 mg total) by mouth daily. 30 tablet 3   pregabalin  (LYRICA ) 75 MG capsule Take 1 capsule (75 mg total) by mouth 2 (two) times daily. 60 capsule 2   propranolol  (INDERAL ) 20 MG tablet Take 1 tablet (20 mg total) by mouth 2 (two) times daily. 60 tablet 2   No facility-administered medications prior to visit.    No Known Allergies  Review of Systems  Constitutional:  Negative for chills, fatigue and fever.  HENT:  Negative for congestion, ear pain and sinus pain.   Respiratory:  Negative for cough and shortness of breath.   Cardiovascular:  Negative for chest pain.  Gastrointestinal:  Negative for abdominal pain, constipation,  diarrhea, nausea and vomiting.  Musculoskeletal:  Positive for back pain. Negative for myalgias.  Neurological:  Negative for headaches.       Objective:        12/17/2023    9:15 AM 11/23/2023   10:36 AM 11/19/2023   10:24 AM  Vitals with BMI  Height 5' 2 5' 2 5' 2  Weight 149 lbs 6 oz 148 lbs 151 lbs 3 oz  BMI 27.32 27.06 27.65  Systolic  132 128 118  Diastolic 82 78 76  Pulse 62 86 68    No data found.   Physical Exam  Musculoskeletal:     Comments: NECK: Slight tenderness over C7, thoracic spinal tenderness, left scapular tenderness, mild tenderness over left scapula blade, left deltoid tenderness.  Painful crossover of left shoulder, slightly painful scratch test on left shoulder, painful subscapular lift off on left shoulder.     Health Maintenance Due  Topic Date Due   Hepatitis C Screening  Never done    There are no preventive care reminders to display for this patient.   Lab Results  Component Value Date   TSH 1.020 11/19/2023   Lab Results  Component Value Date   WBC 6.9 11/19/2023   HGB 12.9 11/19/2023   HCT 38.0 11/19/2023   MCV 84 11/19/2023   PLT 302 11/19/2023   Lab Results  Component Value Date   NA 143 11/19/2023   K 4.6 11/19/2023   CO2 22 11/19/2023   GLUCOSE 66 (L) 11/19/2023   BUN 18 11/19/2023   CREATININE 0.75 11/19/2023   BILITOT 0.2 11/19/2023   ALKPHOS 86 11/19/2023   AST 14 11/19/2023   ALT 18 11/19/2023   PROT 6.8 11/19/2023   ALBUMIN 4.1 11/19/2023   CALCIUM 9.1 11/19/2023   ANIONGAP 10 12/15/2020   EGFR 96 11/19/2023   No results found for: CHOL No results found for: HDL No results found for: LDLCALC No results found for: TRIG No results found for: CHOLHDL No results found for: UJWJ1B     Assessment & Plan:  Acute pain of left shoulder Assessment & Plan: Severe left shoulder pain following a fall two weeks ago, with tenderness over the left scapula and deltoid. Pain is excruciating at night  and worsens with certain movements, suggesting possible rotator cuff involvement. No improvement with muscle relaxants. Suspected strain or bruise; no x-rays needed at this time. Discussed anti-inflammatory medication and physical therapy for recovery. Consider orthopedist referral if no improvement in 3-4 weeks. - Prescribe anti-inflammatory medication MELOXICAM 15 MG TO TAKE  once daily in the morning - Continue muscle relaxant at bedtime - Refer to physical therapy at [facility] in [place] - Consider orthopedist referral if no improvement after 3-4 weeks  Back Pain Chronic back pain exacerbated by recent fall, particularly bothersome during prolonged standing at work. Thoracic spinal tenderness present. Emphasized core strengthening through physical therapy to prevent further injury. - Refer to physical therapy for core strengthening exercises  General Health Maintenance Advised on home safety to prevent falls, emphasizing well-lit rooms and removal of potential hazards. - Maintain well-lit rooms and remove loose sheets or other tripping hazards   Orders: -     Ambulatory referral to Physical Therapy  Other orders -     Meloxicam; Take 1 tablet (15 mg total) by mouth daily as needed for pain.  Dispense: 30 tablet; Refill: 0   Assessment and Plan            Meds ordered this encounter  Medications   meloxicam (MOBIC) 15 MG tablet    Sig: Take 1 tablet (15 mg total) by mouth daily as needed for pain.    Dispense:  30 tablet    Refill:  0    Orders Placed This Encounter  Procedures   Ambulatory referral to Physical Therapy     Follow-up: Return if symptoms worsen or fail to improve.  An After Visit Summary was printed and given to  the patient.  Charlita Brian, MD [facility] [phone]

## 2023-12-17 NOTE — Telephone Encounter (Signed)
 FYI Only or Action Required?: FYI only for provider  Patient was last seen in primary care on 11/23/2023 by Alison Means, FNP. Called Nurse Triage reporting Fall, Shoulder Pain, and Back Pain. Symptoms began 2 weeks ago. Interventions attempted: Prescription medications: Robaxin  and Rest, hydration, or home remedies. Symptoms are: unchanged.  Triage Disposition: See HCP Within 4 Hours (Or PCP Triage)  Patient/caregiver understands and will follow disposition?: Yes  Copied from CRM [id number]. Topic: Clinical - Red Word Triage >> Dec 17, 2023  8:38 AM Alison Simpson wrote: [facility] that prompted transfer to Nurse Triage: Patient called.. back and shoulder - hurting for the last 2 weeks ( using muscle relaxers ) extreme pain.. she had fell 2 weeks ago Reason for Disposition  [1] MODERATE weakness (i.e., interferes with work, school, normal activities) AND [2] new-onset or worsening  Answer Assessment - Initial Assessment Questions 1. MECHANISM: How did the fall happen?     Patient reports that she fell while going to the bathroom in the middle of the night. Patient states she slide on a plastic hanger that was on the floor. Patient states fell backwards and hurt her shoulder and back.  2. DOMESTIC VIOLENCE AND ELDER ABUSE SCREENING: Did you fall because someone pushed you or tried to hurt you? If Yes, ask: Are you safe now?     no 3. ONSET: When did the fall happen? (e.Simpson., minutes, hours, or days ago)     Patient reports falling two weeks ago. 4. LOCATION: What part of the body hit the ground? (e.Simpson., back, buttocks, head, hips, knees, hands, head, stomach)     Left Shoulder and back 5. INJURY: Did you hurt (injure) yourself when you fell? If Yes, ask: What did you injure? Tell me more about this? (e.Simpson., body area; type of injury; pain severity)     Shoulder and back. Patient reports a bruise to the area. 6. PAIN: Is there any pain? If Yes, ask: How bad is the pain? (e.Simpson., Scale  1-10; or mild,  moderate, severe)   - NONE (0): No pain   - MILD (1-3): Doesn't interfere with normal activities    - MODERATE (4-7): Interferes with normal activities or awakens from sleep    - SEVERE (8-10): Excruciating pain, unable to do any normal activities      Shoulder and arm-8 out of 10 7. SIZE: For cuts, bruises, or swelling, ask: How large is it? (e.Simpson., inches or centimeters)      N/a 9. OTHER SYMPTOMS: Do you have any other symptoms? (e.Simpson., dizziness, fever, weakness; new onset or worsening).      no 10. CAUSE: What do you think caused the fall (or falling)? (e.Simpson., tripped, dizzy spell)       tripped  Protocols used: Falls and [facility]

## 2023-12-31 ENCOUNTER — Other Ambulatory Visit: Payer: Self-pay

## 2023-12-31 ENCOUNTER — Telehealth: Payer: Self-pay

## 2023-12-31 MED ORDER — FLUTICASONE-SALMETEROL 250-50 MCG/ACT IN AEPB: 1.0000 | INHALATION_SPRAY | Freq: Two times a day (BID) | RESPIRATORY_TRACT | 3 refills | Status: AC

## 2023-12-31 NOTE — Telephone Encounter (Unsigned)
 Copied from CRM [id number]. Topic: Clinical - Medication Refill >> Dec 31, 2023  9:41 AM Tiffini S wrote: Medication: Advair  Diskus Steroid Inhaler/ need to be prescribed and asking if insurance will cover  Has the patient contacted their pharmacy? No (Agent: If no, request that the patient contact the pharmacy for the refill. If patient does not wish to contact the pharmacy document the reason why and proceed with request.) (Agent: If yes, when and what did the pharmacy advise?)  This is the patient's preferred pharmacy:  [facility] [address] - [place], [place] - [address] [place] [address] Phone: [phone] Fax: [phone]  Is this the correct pharmacy for this prescription? Yes If no, delete pharmacy and type the correct one.   Has the prescription been filled recently? Yes  Is the patient out of the medication? Yes, the patient is having to use her emergency inhaler   Has the patient been seen for an appointment in the last year OR does the patient have an upcoming appointment? Yes  Can we respond through MyChart? Yes  Agent: Please be advised that Rx refills may take up to 3 business days. We ask that you follow-up with your pharmacy.

## 2024-01-15 ENCOUNTER — Other Ambulatory Visit: Payer: Self-pay

## 2024-01-23 ENCOUNTER — Other Ambulatory Visit: Payer: Self-pay

## 2024-01-24 DIAGNOSIS — M25612 Stiffness of left shoulder, not elsewhere classified: Secondary | ICD-10-CM | POA: Diagnosis not present

## 2024-01-24 DIAGNOSIS — M25512 Pain in left shoulder: Secondary | ICD-10-CM | POA: Diagnosis not present

## 2024-02-01 ENCOUNTER — Other Ambulatory Visit: Payer: Self-pay

## 2024-02-01 ENCOUNTER — Ambulatory Visit: Payer: Self-pay

## 2024-02-01 ENCOUNTER — Encounter: Payer: Self-pay | Admitting: Family Medicine

## 2024-02-01 ENCOUNTER — Ambulatory Visit: Admitting: Family Medicine

## 2024-02-01 VITALS — BP 138/88 | HR 63 | Temp 97.8°F | Ht 62.0 in | Wt 149.0 lb

## 2024-02-01 DIAGNOSIS — M25612 Stiffness of left shoulder, not elsewhere classified: Secondary | ICD-10-CM | POA: Diagnosis not present

## 2024-02-01 DIAGNOSIS — M25512 Pain in left shoulder: Secondary | ICD-10-CM | POA: Diagnosis not present

## 2024-02-01 DIAGNOSIS — M797 Fibromyalgia: Secondary | ICD-10-CM

## 2024-02-01 MED ORDER — LIDOCAINE 5 % EX PTCH: 1.0000 | MEDICATED_PATCH | CUTANEOUS | 0 refills | Status: DC

## 2024-02-01 MED ORDER — PREDNISONE 20 MG PO TABS: ORAL_TABLET | ORAL | 0 refills | Status: AC

## 2024-02-01 NOTE — Telephone Encounter (Signed)
 1st attempt, no answer. Left voicemail for patient to return call for nurse triage.    Copied from CRM [id number]. Topic: Clinical - Medical Advice >> Feb 01, 2024  8:04 AM Larissa RAMAN wrote: Reason for CRM: Patient states she is having shoulder pain after completing physical therapy. She states she has tried over the counter meds, but they are not helping. She is wanting provider to call in a medication to her pharmacy.  Callback # [phone]

## 2024-02-01 NOTE — Assessment & Plan Note (Addendum)
 Fibromyalgia with previous normal nerve conduction studies. New nerve-related symptoms suggest reevaluation. - Order nerve conduction test to reassess for changes related to fibromyalgia.  - Continue Lyrica  75 mg TWICE A DAY for pain

## 2024-02-01 NOTE — Telephone Encounter (Signed)
 FYI Only or Action Required?: FYI only for provider.  Patient was last seen in primary care on 12/17/2023 by Sirivol, Mamatha, MD.  Called Nurse Triage reporting Pain.  Symptoms began Ongoing - worse with PT.  Interventions attempted: Prescription medications: and lidocaine.  Symptoms are: gradually worsening.  Triage Disposition: See PCP When Office is Open (Within 3 Days)  Patient/caregiver understands and will follow disposition?: Yes             Reason for Disposition  [1] MODERATE pain (e.g., interferes with normal activities) AND [2] present > 3 days  Answer Assessment - Initial Assessment Questions 1. ONSET: When did the pain start?     Ongoing - worse since starting PT 2. LOCATION: Where is the pain located?     Shoulder  3. PAIN: How bad is the pain? (Scale 1-10; or mild, moderate, severe)     Can't sleep 4. WORK OR EXERCISE: Has there been any recent work or exercise that involved this part of the body?     yes 5. CAUSE: What do you think is causing the shoulder pain?     Nerve pain 6. OTHER SYMPTOMS: Do you have any other symptoms? (e.g., neck pain, swelling, rash, fever, numbness, weakness)     No  Protocols used: Shoulder Pain-A-AH

## 2024-02-01 NOTE — Progress Notes (Signed)
 Acute Office Visit  Subjective:    Patient ID: Alison Simpson, female    DOB: 1970-07-14, 53 y.o.   MRN: [id number]  Chief Complaint  Patient presents with   Left shoulder pain    Discussed the use of AI scribe software for clinical note transcription with the patient, who gave verbal consent to proceed.  History of Present Illness   Alison Simpson is a 53 year old female with fibromyalgia who presents with acute left shoulder pain.  She experiences acute left shoulder pain that has been persistent and worsening. Initially localized to the shoulder, the pain now radiates through her chest and down her arms. It is described as constant, aching, and sometimes burning, with sensations of heat and coldness. Current treatments, including muscle relaxers and meloxicam , have not provided relief. Physical therapy sessions have intensified the pain, particularly after exercises.  She has a history of fibromyalgia, diagnosed several years ago, and previously underwent normal nerve conduction studies. She is currently taking Lyrica  for nerve pain and uses meloxicam  daily without relief. She has tried aspirin, Lanacane, and heat application, noting that heat provides more relief than cold.  No pins and needles sensation or itching, but there is a persistent aching pain, and the patient has noticed some burning pain this week. She has not tried lidocaine patches or steroids for the current issue. Certain movements, particularly backward motions of the arm, exacerbate the pain, while other motions are less painful. She is concerned about the impact of physical therapy on her condition, as it seems to increase her pain levels.      Past Medical History:  Diagnosis Date   Arm pain    Asthma    COVID-19 07/05/2020   Hypertension    Pregnancy induced hypertension    Tingling     Past Surgical History:  Procedure Laterality Date   TUBAL LIGATION     1999    Family History  Problem Relation Age of  Onset   Cataracts Mother    Other Father        unsure of medical history   Diabetes Neg Hx    Hypertension Neg Hx    Coronary artery disease Neg Hx     Social History   Socioeconomic History   Marital status: Married    Spouse name: Not on file   Number of children: 1   Years of education: some college   Highest education level: GED or equivalent  Occupational History   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   Employed full time. Walks 20 min twice/week.    Right-handed.   1-5 glasses of soft drinks per day.   Social Drivers of Corporate investment banker Strain: Low Risk  (10/04/2023)   Overall Financial Resource Strain (CARDIA)    Difficulty of Paying Living Expenses: Not hard at all  Food Insecurity: No Food Insecurity (10/04/2023)   Hunger Vital Sign    Worried About Running Out of Food in the Last Year: Never true    Ran Out of Food in the Last Year: Never true  Transportation Needs: No Transportation Needs (10/04/2023)   PRAPARE - Administrator, Civil Service (Medical): No    Lack of Transportation (Non-Medical): No  Physical Activity: Inactive (10/04/2023)   Exercise Vital Sign    Days of Exercise per Week: 0 days    Minutes of Exercise per Session: 0 min  Stress: No Stress Concern Present (10/04/2023)   [employer] of Occupational Health - Occupational Stress Questionnaire    Feeling of Stress : Not at all  Social Connections: Moderately Integrated (10/04/2023)   Social Connection and Isolation Panel    Frequency of Communication with Friends and Family: More than three times a week    Frequency of Social Gatherings with Friends  and Family: Once a week    Attends Religious Services: More than 4 times per year    Active Member of [employer] or Organizations: No    Attends Banker Meetings: Never    Marital Status: Married  Catering manager Violence: Not At Risk (10/04/2023)   Humiliation, Afraid, Rape, and Kick questionnaire    Fear of Current or Ex-Partner: No    Emotionally Abused: No    Physically Abused: No    Sexually Abused: No    Outpatient Medications Prior to Visit  Medication Sig Dispense Refill   albuterol  (PROVENTIL ) (2.5 MG/3ML) 0.083% nebulizer solution Take 3 mLs (2.5 mg total) by nebulization every 6 (six) hours as needed. 75 mL 0   fluticasone -salmeterol (ADVAIR  DISKUS) 250-50 MCG/ACT AEPB Inhale 1 puff into the lungs in the morning and at bedtime. 30 each 3   meloxicam  (MOBIC ) 15 MG tablet Take 1 tablet by mouth once daily as needed for pain. 30 tablet 0   montelukast  (SINGULAIR ) 10 MG tablet Take 1 tablet (10 mg total) by mouth at bedtime. 90 tablet 0   olmesartan  (BENICAR ) 40 MG tablet Take 1 tablet (40 mg total) by mouth daily. 90 tablet 0   pantoprazole  (PROTONIX ) 40 MG tablet Take 1 tablet (40 mg total) by mouth daily. 30 tablet 3   pregabalin  (LYRICA ) 75 MG capsule Take 1 capsule (75 mg total) by mouth 2 (two) times daily. 60 capsule 2   propranolol  (INDERAL ) 20 MG tablet Take 1 tablet by mouth 2 times daily. 60 tablet 2   No facility-administered medications prior to visit.    No Known Allergies  Review of Systems  Constitutional:  Negative for appetite change, fatigue and fever.  HENT:  Negative for congestion, ear pain, sinus pressure and sore throat.   Respiratory:  Negative for cough, chest tightness, shortness of breath and wheezing.   Cardiovascular:  Negative for chest pain and palpitations.  Gastrointestinal:  Negative for abdominal pain, constipation, diarrhea, nausea and vomiting.  Genitourinary:  Negative for dysuria and hematuria.  Musculoskeletal:  Positive for  myalgias (Left shoulder pain). Negative for arthralgias, back pain and joint swelling.  Skin:  Negative for rash.  Neurological:  Negative for dizziness, weakness and headaches.  Psychiatric/Behavioral:  Negative for dysphoric mood. The patient is not nervous/anxious.        Objective:        02/01/2024   10:24 AM 12/17/2023    9:15 AM 11/23/2023   10:36 AM  Vitals with BMI  Height 5' 2 5' 2 5' 2  Weight 149 lbs 149 lbs 6 oz 148 lbs  BMI 27.25 27.32 27.06  Systolic 138 132 871  Diastolic 88 82 78  Pulse 63 62 86    Orthostatic VS for the  past 72 hrs (Last 3 readings):  Patient Position BP Location  02/01/24 1024 Sitting Right Arm     Physical Exam Vitals reviewed.  Constitutional:      General: She is not in acute distress.    Appearance: Normal appearance.  Cardiovascular:     Rate and Rhythm: Normal rate and regular rhythm.     Heart sounds: Normal heart sounds. No murmur heard. Pulmonary:     Effort: Pulmonary effort is normal.     Breath sounds: Normal breath sounds. No wheezing.  Abdominal:     General: Bowel sounds are normal.     Palpations: Abdomen is soft.     Tenderness: There is no abdominal tenderness.  Musculoskeletal:     Right shoulder: Normal.     Left shoulder: Swelling present. No deformity or bony tenderness. Decreased range of motion (certain movements exacerbate pain). Decreased strength.  Neurological:     Mental Status: She is alert. Mental status is at baseline.  Psychiatric:        Mood and Affect: Mood normal.        Behavior: Behavior normal.     Health Maintenance Due  Topic Date Due   Hepatitis C Screening  Never done   Pneumococcal Vaccine: 50+ Years (1 of 2 - PCV) Never done   Hepatitis B Vaccines (1 of 3 - 19+ 3-dose series) Never done   Zoster Vaccines- Shingrix (1 of 2) Never done   COVID-19 Vaccine (6 - Pfizer risk 2024-25 season) 01/28/2024   INFLUENZA VACCINE  02/01/2024       Topic Date Due   Hepatitis B Vaccines  (1 of 3 - 19+ 3-dose series) Never done     Lab Results  Component Value Date   TSH 1.020 11/19/2023   Lab Results  Component Value Date   WBC 6.9 11/19/2023   HGB 12.9 11/19/2023   HCT 38.0 11/19/2023   MCV 84 11/19/2023   PLT 302 11/19/2023   Lab Results  Component Value Date   NA 143 11/19/2023   K 4.6 11/19/2023   CO2 22 11/19/2023   GLUCOSE 66 (L) 11/19/2023   BUN 18 11/19/2023   CREATININE 0.75 11/19/2023   BILITOT 0.2 11/19/2023   ALKPHOS 86 11/19/2023   AST 14 11/19/2023   ALT 18 11/19/2023   PROT 6.8 11/19/2023   ALBUMIN 4.1 11/19/2023   CALCIUM 9.1 11/19/2023   ANIONGAP 10 12/15/2020   EGFR 96 11/19/2023   No results found for: CHOL No results found for: HDL No results found for: LDLCALC No results found for: TRIG No results found for: CHOLHDL No results found for: YHAJ8R     Assessment & Plan:  Acute pain of left shoulder Assessment & Plan: Severe left shoulder pain following a fall over a month ago, with tenderness over the left scapula and deltoid. Pain is excruciating at night and worsens with certain movements, suggesting possible rotator cuff involvement. No improvement with muscle relaxants or PT. Suspected strain or bruise; no x-rays have been ordered. Currently left shoulder pain with radiation to chest and arm.Physical therapy suggests possible nerve involvement. Differential includes nerve pain versus tendinitis or rotator cuff strain. Previous nerve conduction studies were normal, but new symptoms warrant reevaluation. - Order nerve conduction test to assess for nerve damage. - Prescribe lidocaine patches for pain management. - Initiate short course of prednisone  to reduce inflammation, noting potential for increased blood sugar. - Continue physical therapy biweekly with home exercises. - Consider TENS unit  for additional pain relief pending insurance approval. - Continue anti-inflammatory medication MELOXICAM  15 MG TO TAKE  once  daily in the morning - Continue muscle relaxant at bedtime - Consider orthopedist referral if no improvement after 3-4 weeks   Orders: -     Lidocaine; Place 1 patch onto the skin daily. Remove & Discard patch within 12 hours or as directed by MD  Dispense: 30 patch; Refill: 0 -     predniSONE ; Take 3 tablets (60 mg total) by mouth daily with breakfast for 3 days, THEN 2 tablets (40 mg total) daily with breakfast for 3 days, THEN 1 tablet (20 mg total) daily with breakfast for 3 days.  Dispense: 18 tablet; Refill: 0 -     Nerve conduction test; Future -     OT neurostimulator; Future      Meds ordered this encounter  Medications   lidocaine (LIDODERM) 5 %    Sig: Place 1 patch onto the skin daily. Remove & Discard patch within 12 hours or as directed by MD    Dispense:  30 patch    Refill:  0   predniSONE  (DELTASONE ) 20 MG tablet    Sig: Take 3 tablets (60 mg total) by mouth daily with breakfast for 3 days, THEN 2 tablets (40 mg total) daily with breakfast for 3 days, THEN 1 tablet (20 mg total) daily with breakfast for 3 days.    Dispense:  18 tablet    Refill:  0       Follow-up: Return if symptoms worsen or fail to improve.  An After Visit Summary was printed and given to the patient.   I,Lauren M Auman,acting as a scribe for Harrie CHRISTELLA Cedar, FNP.,have documented all relevant documentation on the behalf of Harrie CHRISTELLA Cedar, FNP,as directed by  Harrie CHRISTELLA Cedar, FNP while in the presence of Harrie CHRISTELLA Cedar, FNP.    I attest that I have reviewed this visit and agree with the plan scribed by my staff.   Harrie CHRISTELLA Cedar, FNP [facility] [phone]

## 2024-02-01 NOTE — Assessment & Plan Note (Signed)
 Severe left shoulder pain following a fall over a month ago, with tenderness over the left scapula and deltoid. Pain is excruciating at night and worsens with certain movements, suggesting possible rotator cuff involvement. No improvement with muscle relaxants or PT. Suspected strain or bruise; no x-rays have been ordered. Currently left shoulder pain with radiation to chest and arm.Physical therapy suggests possible nerve involvement. Differential includes nerve pain versus tendinitis or rotator cuff strain. Previous nerve conduction studies were normal, but new symptoms warrant reevaluation. - Order nerve conduction test to assess for nerve damage. - Prescribe lidocaine patches for pain management. - Initiate short course of prednisone  to reduce inflammation, noting potential for increased blood sugar. - Continue physical therapy biweekly with home exercises. - Consider TENS unit for additional pain relief pending insurance approval. - Continue anti-inflammatory medication MELOXICAM  15 MG TO TAKE  once daily in the morning - Continue muscle relaxant at bedtime - Consider orthopedist referral if no improvement after 3-4 weeks

## 2024-02-11 ENCOUNTER — Encounter: Payer: Self-pay | Admitting: Family Medicine

## 2024-02-11 ENCOUNTER — Ambulatory Visit: Admitting: Family Medicine

## 2024-02-11 VITALS — BP 106/64 | HR 70 | Temp 98.7°F | Resp 16 | Ht 62.0 in | Wt 155.0 lb

## 2024-02-11 DIAGNOSIS — M25512 Pain in left shoulder: Secondary | ICD-10-CM | POA: Diagnosis not present

## 2024-02-11 DIAGNOSIS — M797 Fibromyalgia: Secondary | ICD-10-CM

## 2024-02-11 NOTE — Progress Notes (Signed)
 Subjective:  Patient ID: Stephane JONELLE Rakers, female    DOB: May 02, 1971  Age: 53 y.o. MRN: [id number]  Chief Complaint  Patient presents with   Follow-up    SHOULDER PAIN   Discussed the use of AI scribe software for clinical note transcription with the patient, who gave verbal consent to proceed.  History of Present Illness   Wilmary R Asquith is a 53 year old female with fibromyalgia who presents with shoulder pain and medication management concerns.  She experiences shoulder pain exacerbated by certain movements, primarily located in the shoulder. The pain worsens at night. She uses lidocaine  patches, which provide significant relief, allowing her to be pain-free for a weekend after a single use. She also uses Tylenol  for pain management.  She has a history of fibromyalgia and previously used gabapentin, taking 300 mg daily. Recently, she inadvertently took a 900 mg dose, which made her feel unwell. She has since switched to Lyrica . She is cautious about medication dependency and prefers to use medications only when necessary.  She has been prescribed meloxicam  for pain, which she takes sparingly, about once or twice a week, due to concerns about long-term NSAID use. She also uses a muscle relaxer occasionally at night to aid sleep when the pain is severe.  She discontinued physical therapy due to increased pain following sessions and performs exercises at home. Her sister, who is in healthcare, suggested using a TENS unit for pain management.  She takes a women's 50+ multivitamin along with a vitamin D and calcium supplement provided by her mother. She is concerned about the potential for excessive vitamin intake and plans to have her levels checked.         12/17/2023    9:18 AM 11/19/2023   10:26 AM 10/26/2023    8:24 AM 10/04/2023    7:57 AM 05/25/2023   10:54 AM  Depression screen PHQ 2/9  Decreased Interest 0 0 0 0 0  Down, Depressed, Hopeless 0 0 0 0 0  PHQ - 2 Score 0 0 0 0 0   Altered sleeping     0  Tired, decreased energy     0  Change in appetite     0  Feeling bad or failure about yourself      0  Trouble concentrating     0  Moving slowly or fidgety/restless     0  Suicidal thoughts     0  PHQ-9 Score     0  Difficult doing work/chores     Not difficult at all        12/17/2023    9:18 AM  Fall Risk   Falls in the past year? 1  Number falls in past yr: 0  Injury with Fall? 1  Risk for fall due to : History of fall(s)    Patient Care Team: Teressa Harrie HERO, FNP as PCP - General (Family Medicine)   Review of Systems  Constitutional:  Negative for chills, diaphoresis, fatigue and fever.  HENT:  Negative for congestion, ear pain and sinus pain.   Eyes: Negative.   Respiratory:  Negative for cough and shortness of breath.   Cardiovascular:  Negative for chest pain.  Gastrointestinal:  Negative for abdominal pain, constipation, diarrhea, nausea and vomiting.  Endocrine: Negative.   Genitourinary:  Negative for dysuria, frequency and urgency.  Musculoskeletal:  Positive for arthralgias (left shoulder pain) and myalgias (left arm pain).  Allergic/Immunologic: Negative.   Neurological:  Negative for dizziness,  weakness, light-headedness and headaches.  Hematological: Negative.   Psychiatric/Behavioral:  Negative for dysphoric mood. The patient is not nervous/anxious.     Current Outpatient Medications on File Prior to Visit  Medication Sig Dispense Refill   albuterol  (PROVENTIL ) (2.5 MG/3ML) 0.083% nebulizer solution Take 3 mLs (2.5 mg total) by nebulization every 6 (six) hours as needed. 75 mL 0   fluticasone -salmeterol (ADVAIR  DISKUS) 250-50 MCG/ACT AEPB Inhale 1 puff into the lungs in the morning and at bedtime. 30 each 3   lidocaine  (LIDODERM ) 5 % Place 1 patch onto the skin daily. Remove & Discard patch within 12 hours or as directed by MD 30 patch 0   montelukast  (SINGULAIR ) 10 MG tablet Take 1 tablet (10 mg total) by mouth at bedtime. 90  tablet 0   olmesartan  (BENICAR ) 40 MG tablet Take 1 tablet (40 mg total) by mouth daily. 90 tablet 0   pantoprazole  (PROTONIX ) 40 MG tablet Take 1 tablet (40 mg total) by mouth daily. 30 tablet 3   pregabalin  (LYRICA ) 75 MG capsule Take 1 capsule by mouth 2 times daily. 60 capsule 2   propranolol  (INDERAL ) 20 MG tablet Take 1 tablet by mouth 2 times daily. 60 tablet 2   meloxicam  (MOBIC ) 15 MG tablet Take 1 tablet by mouth once daily as needed for pain. (Patient not taking: Reported on 02/11/2024) 30 tablet 0   No current facility-administered medications on file prior to visit.   Past Medical History:  Diagnosis Date   Arm pain    Asthma    COVID-19 07/05/2020   Hypertension    Pregnancy induced hypertension    Tingling    Past Surgical History:  Procedure Laterality Date   TUBAL LIGATION     1999    Family History  Problem Relation Age of Onset   Cataracts Mother    Other Father        unsure of medical history   Diabetes Neg Hx    Hypertension Neg Hx    Coronary artery disease Neg Hx    Social History   Socioeconomic History   Marital status: Married    Spouse name: Not on file   Number of children: 1   Years of education: some college   Highest education level: GED or equivalent  Occupational History   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   Employed full time. Walks 20 min twice/week.    Right-handed.   1-5 glasses of soft drinks per day.   Social Drivers of Corporate investment banker Strain: Low Risk  (10/04/2023)   Overall Financial Resource Strain (CARDIA)    Difficulty of Paying Living Expenses: Not hard at all  Food Insecurity: No  Food Insecurity (10/04/2023)   Hunger Vital Sign    Worried About Running Out of Food in the Last Year: Never true    Ran Out of Food in the Last Year: Never true  Transportation Needs: No Transportation Needs (10/04/2023)   PRAPARE - Administrator, Civil Service (Medical): No    Lack of Transportation (Non-Medical): No  Physical Activity:  Inactive (10/04/2023)   Exercise Vital Sign    Days of Exercise per Week: 0 days    Minutes of Exercise per Session: 0 min  Stress: No Stress Concern Present (10/04/2023)   [employer] of Occupational Health - Occupational Stress Questionnaire    Feeling of Stress : Not at all  Social Connections: Moderately Integrated (10/04/2023)   Social Connection and Isolation Panel    Frequency of Communication with Friends and Family: More than three times a week    Frequency of Social Gatherings with Friends and Family: Once a week    Attends Religious Services: More than 4 times per year    Active Member of [employer] or Organizations: No    Attends Engineer, structural: Never    Marital Status: Married    Objective:  BP 106/64   Pulse 70   Temp 98.7 F (37.1 C) (Temporal)   Resp 16   Ht 5' 2 (1.575 m)   Wt 155 lb (70.3 kg)   SpO2 97%   BMI 28.35 kg/m      02/11/2024    3:37 PM 02/01/2024   10:24 AM 12/17/2023    9:15 AM  BP/Weight  Systolic BP 106 138 132  Diastolic BP 64 88 82  Wt. (Lbs) 155 149 149.4  BMI 28.35 kg/m2 27.25 kg/m2 27.33 kg/m2    Physical Exam Vitals reviewed.  Constitutional:      General: She is not in acute distress.    Appearance: Normal appearance.  Eyes:     Conjunctiva/sclera: Conjunctivae normal.  Cardiovascular:     Rate and Rhythm: Normal rate and regular rhythm.     Heart sounds: Normal heart sounds. No murmur heard. Pulmonary:     Effort: Pulmonary effort is normal.     Breath sounds: Normal breath sounds. No wheezing.  Abdominal:     General: Bowel sounds are normal.     Palpations:  Abdomen is soft.  Musculoskeletal:     Right elbow: Normal.     Left elbow: Decreased range of motion.  Neurological:     Mental Status: She is alert. Mental status is at baseline.  Psychiatric:        Mood and Affect: Mood normal.        Behavior: Behavior normal.    Lab Results  Component Value Date   WBC 6.9 11/19/2023   HGB 12.9 11/19/2023   HCT 38.0 11/19/2023   PLT 302 11/19/2023   GLUCOSE 66 (L) 11/19/2023   ALT 18 11/19/2023   AST 14 11/19/2023   NA 143 11/19/2023   K 4.6 11/19/2023   CL 107 (H) 11/19/2023   CREATININE 0.75 11/19/2023   BUN 18 11/19/2023   CO2 22 11/19/2023   TSH 1.020 11/19/2023   INR 0.9 09/23/2023      Assessment & Plan:  Acute pain of left shoulder Assessment & Plan: Improved Left shoulder pain exacerbated by certain movements. Relief from lidocaine  patches. Physical therapy increased pain. Managed with home exercises, meloxicam , and muscle relaxers. Considering TENS unit. - Order TENS unit for home use. - Continue home exercises and heat and ice as tolerated. - Use lidocaine  patches as needed. - Use meloxicam  as needed, avoid daily use. - Use muscle relaxers sparingly, primarily at night.   Fibromyalgia Assessment & Plan: Chronic fibromyalgia with intermittent pain. Transitioned to Lyrica  from gabapentin due to dependency concerns. Prefers minimal medication use. - Continue Lyrica . - Avoid high doses of gabapentin. - Monitor pain levels and  adjust medication as needed.    Follow-up: Return in about 16 days (around 02/27/2024) for chronic, fasting.   I,Angela Taylor,acting as a Neurosurgeon for Harrie CHRISTELLA Cedar, FNP.,have documented all relevant documentation on the behalf of Harrie CHRISTELLA Cedar, FNP,as directed by  Harrie CHRISTELLA Cedar, FNP while in the presence of Harrie CHRISTELLA Cedar, FNP.   An After Visit Summary was printed and given to the patient.  I attest that I have reviewed this visit and agree with the plan scribed by my staff.   Harrie CHRISTELLA Cedar, FNP [facility] [phone]

## 2024-02-12 ENCOUNTER — Telehealth: Payer: Self-pay

## 2024-02-12 NOTE — Assessment & Plan Note (Signed)
 Chronic fibromyalgia with intermittent pain. Transitioned to Lyrica  from gabapentin due to dependency concerns. Prefers minimal medication use. - Continue Lyrica . - Avoid high doses of gabapentin. - Monitor pain levels and adjust medication as needed.

## 2024-02-12 NOTE — Assessment & Plan Note (Addendum)
 Improved Left shoulder pain exacerbated by certain movements. Relief from lidocaine  patches. Physical therapy increased pain. Managed with home exercises, meloxicam , and muscle relaxers. Considering TENS unit. - Order TENS unit for home use. - Continue home exercises and heat and ice as tolerated. - Use lidocaine  patches as needed. - Use meloxicam  as needed, avoid daily use. - Use muscle relaxers sparingly, primarily at night.

## 2024-02-12 NOTE — Telephone Encounter (Signed)
 Copied from CRM #[id number]. Topic: Referral - Question >> Feb 12, 2024  3:36 PM Rosaria E wrote: Reason for CRM: Pt called reporting that she is missing orders for a nerve conduction test. Please advise, said she discussed this two weeks ago with her PCP.  Best contact: [phone]

## 2024-02-13 ENCOUNTER — Other Ambulatory Visit: Payer: Self-pay | Admitting: Family Medicine

## 2024-02-13 ENCOUNTER — Other Ambulatory Visit: Payer: Self-pay

## 2024-02-13 DIAGNOSIS — M797 Fibromyalgia: Secondary | ICD-10-CM

## 2024-02-13 DIAGNOSIS — M25512 Pain in left shoulder: Secondary | ICD-10-CM

## 2024-02-14 ENCOUNTER — Other Ambulatory Visit: Payer: Self-pay

## 2024-02-14 DIAGNOSIS — R202 Paresthesia of skin: Secondary | ICD-10-CM

## 2024-02-14 NOTE — Addendum Note (Signed)
 Addended by: DASIE RHODY A on: 02/14/2024 09:20 AM   Modules accepted: Orders

## 2024-02-20 ENCOUNTER — Ambulatory Visit

## 2024-02-27 ENCOUNTER — Ambulatory Visit: Admitting: Family Medicine

## 2024-02-28 ENCOUNTER — Other Ambulatory Visit: Payer: Self-pay | Admitting: Family Medicine

## 2024-02-28 DIAGNOSIS — M25512 Pain in left shoulder: Secondary | ICD-10-CM

## 2024-03-14 ENCOUNTER — Other Ambulatory Visit: Payer: Self-pay

## 2024-04-04 ENCOUNTER — Encounter: Payer: Self-pay | Admitting: Family Medicine

## 2024-04-04 ENCOUNTER — Ambulatory Visit: Admitting: Family Medicine

## 2024-04-04 ENCOUNTER — Ambulatory Visit: Payer: Self-pay | Admitting: Family Medicine

## 2024-04-04 ENCOUNTER — Ambulatory Visit: Admitting: Neurology

## 2024-04-04 VITALS — BP 122/76 | HR 75 | Temp 98.6°F | Resp 16 | Ht 62.0 in | Wt 156.6 lb

## 2024-04-04 DIAGNOSIS — I10 Essential (primary) hypertension: Secondary | ICD-10-CM | POA: Diagnosis not present

## 2024-04-04 DIAGNOSIS — J302 Other seasonal allergic rhinitis: Secondary | ICD-10-CM | POA: Insufficient documentation

## 2024-04-04 DIAGNOSIS — R202 Paresthesia of skin: Secondary | ICD-10-CM | POA: Diagnosis not present

## 2024-04-04 DIAGNOSIS — K219 Gastro-esophageal reflux disease without esophagitis: Secondary | ICD-10-CM | POA: Diagnosis not present

## 2024-04-04 DIAGNOSIS — M797 Fibromyalgia: Secondary | ICD-10-CM

## 2024-04-04 DIAGNOSIS — E663 Overweight: Secondary | ICD-10-CM | POA: Diagnosis not present

## 2024-04-04 DIAGNOSIS — Z23 Encounter for immunization: Secondary | ICD-10-CM

## 2024-04-04 NOTE — Assessment & Plan Note (Addendum)
 BMI 28.64, not at goal - Continue to work on diet and exercise Orders:   Lipid panel

## 2024-04-04 NOTE — Progress Notes (Signed)
 Subjective:  Patient ID: Alison Simpson, female    DOB: [birth year]  Age: 53 y.o. MRN: [id number]  Chief Complaint  Patient presents with   Medical Management of Chronic Issues    Discussed the use of AI scribe software for clinical note transcription with the patient, who gave verbal consent to proceed.  History of Present Illness   Alison Simpson is a 53 year old female with fibromyalgia and hypertension who presents for a chronic follow-up visit.  Fibromyalgia - Increased pain levels and brain fog over the past week - Pattern of 'good days and bad days' with fibromyalgia symptoms - Particularly challenging week, possibly related to change in weather - Difficulty lifting arm one morning - Localized back pain - Current management includes lidocaine  patches (cut in half), Lyrica , and meloxicam   GERD - Currently taking Protonix  for acid reflux with good control and no major flare-ups - Previously tried omeprazole, which was ineffective  Hypertension  - Currently taking propranolol  and olmesartan  with good blood pressure control - No chest pain, shortness of breath, headaches, or blurred vision  Appetite and weight changes - Increased appetite and weight, which is unusual compared to baseline of low appetite and low weight - Thyroid  function checked in May and was normal        12/17/2023    9:18 AM 11/19/2023   10:26 AM 10/26/2023    8:24 AM 10/04/2023    7:57 AM 05/25/2023   10:54 AM  Depression screen PHQ 2/9  Decreased Interest 0 0 0 0 0  Down, Depressed, Hopeless 0 0 0 0 0  PHQ - 2 Score 0 0 0 0 0  Altered sleeping     0  Tired, decreased energy     0  Change in appetite     0  Feeling bad or failure about yourself      0  Trouble concentrating     0  Moving slowly or fidgety/restless     0  Suicidal thoughts     0  PHQ-9 Score     0  Difficult doing work/chores     Not difficult at all        12/17/2023    9:18 AM  Fall Risk   Falls in the past year? 1  Number  falls in past yr: 0  Injury with Fall? 1  Risk for fall due to : History of fall(s)    Patient Care Team: Teressa Harrie HERO, FNP as PCP - General (Family Medicine)   Review of Systems  Constitutional:  Negative for chills, diaphoresis, fatigue and fever.  HENT:  Negative for congestion, ear pain and sinus pain.   Eyes: Negative.  Negative for visual disturbance.  Respiratory:  Negative for cough and shortness of breath.   Cardiovascular:  Negative for chest pain.  Gastrointestinal:  Negative for abdominal pain, constipation, diarrhea, nausea and vomiting.  Endocrine: Negative.   Genitourinary:  Negative for dysuria, frequency and urgency.  Musculoskeletal:  Negative for arthralgias.  Allergic/Immunologic: Negative.   Neurological:  Negative for dizziness, weakness, light-headedness and headaches.  Hematological: Negative.   Psychiatric/Behavioral:  Negative for dysphoric mood. The patient is not nervous/anxious.     Current Outpatient Medications on File Prior to Visit  Medication Sig Dispense Refill   albuterol  (PROVENTIL ) (2.5 MG/3ML) 0.083% nebulizer solution Take 3 mLs (2.5 mg total) by nebulization every 6 (six) hours as needed. 75 mL 0   fluticasone -salmeterol (ADVAIR  DISKUS) 250-50 MCG/ACT AEPB Inhale  1 puff into the lungs in the morning and at bedtime. 30 each 3   lidocaine  (LIDODERM ) 5 % APPLY 1 PATCH ONTO THE SKIN DAILY, REMOVE AND DISCARD AFTER 12 HOURS 30 patch 0   meloxicam  (MOBIC ) 15 MG tablet Take 1 tablet by mouth once daily as needed for pain. 30 tablet 0   montelukast  (SINGULAIR ) 10 MG tablet Take 1 tablet (10 mg total) by mouth at bedtime. 90 tablet 0   olmesartan  (BENICAR ) 40 MG tablet Take 1 tablet (40 mg total) by mouth daily. 90 tablet 0   pantoprazole  (PROTONIX ) 40 MG tablet Take 1 tablet by mouth once daily. 30 tablet 3   pregabalin  (LYRICA ) 75 MG capsule Take 1 capsule by mouth 2 times daily. 60 capsule 2   propranolol  (INDERAL ) 20 MG tablet Take 1 tablet by  mouth 2 times daily. 60 tablet 2   No current facility-administered medications on file prior to visit.   Past Medical History:  Diagnosis Date   Arm pain    Asthma    COVID-19 07/05/2020   Hypertension    Pregnancy induced hypertension    Tingling    Past Surgical History:  Procedure Laterality Date   TUBAL LIGATION     1999    Family History  Problem Relation Age of Onset   Cataracts Mother    Other Father        unsure of medical history   Diabetes Neg Hx    Hypertension Neg Hx    Coronary artery disease Neg Hx    Social History   Socioeconomic History   Marital status: Married    Spouse name: Not on file   Number of children: 1   Years of education: some college   Highest education level: GED or equivalent  Occupational History   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   Employed full time. Walks 20 min twice/week.    Right-handed.   1-5 glasses of soft drinks per day.   Social Drivers of Corporate investment banker Strain: Low Risk  (10/04/2023)   Overall Financial Resource Strain (CARDIA)    Difficulty of Paying Living Expenses: Not hard at all  Food Insecurity: No Food Insecurity (10/04/2023)   Hunger Vital Sign    Worried About Running Out of Food in the Last Year: Never true    Ran Out of Food in the Last Year: Never true  Transportation Needs: No Transportation Needs (10/04/2023)   PRAPARE - Administrator, Civil Service (Medical): No    Lack of Transportation (Non-Medical): No  Physical Activity: Inactive (10/04/2023)   Exercise Vital Sign    Days of Exercise per Week: 0 days    Minutes of Exercise per Session: 0 min   Stress: No Stress Concern Present (10/04/2023)   [employer] of Occupational Health - Occupational Stress Questionnaire    Feeling of Stress : Not at all  Social Connections: Moderately Integrated (10/04/2023)   Social Connection and Isolation Panel    Frequency of Communication with Friends and Family: More than three times a week  Frequency of Social Gatherings with Friends and Family: Once a week    Attends Religious Services: More than 4 times per year    Active Member of [employer] or Organizations: No    Attends Engineer, structural: Never    Marital Status: Married    Objective:  BP 122/76   Pulse 75   Temp 98.6 F (37 C) (Temporal)   Resp 16   Ht 5' 2 (1.575 m)   Wt 156 lb 9.6 oz (71 kg)   SpO2 99%   BMI 28.64 kg/m      04/04/2024    8:24 AM 02/11/2024    3:37 PM 02/01/2024   10:24 AM  BP/Weight  Systolic BP 122 106 138  Diastolic BP 76 64 88  Wt. (Lbs) 156.6 155 149  BMI 28.64 kg/m2 28.35 kg/m2 27.25 kg/m2    Physical Exam Vitals reviewed.  Constitutional:      General: She is not in acute distress.    Appearance: Normal appearance. She is not ill-appearing.  Eyes:     Conjunctiva/sclera: Conjunctivae normal.  Cardiovascular:     Rate and Rhythm: Normal rate and regular rhythm.     Heart sounds: Normal heart sounds. No murmur heard. Pulmonary:     Effort: Pulmonary effort is normal. No respiratory distress.     Breath sounds: Normal breath sounds. No wheezing.  Abdominal:     Palpations: Abdomen is soft.  Musculoskeletal:        General: Normal range of motion.  Skin:    General: Skin is warm.  Neurological:     Mental Status: She is alert. Mental status is at baseline.  Psychiatric:        Mood and Affect: Mood normal.        Behavior: Behavior normal.    Lab Results  Component Value Date   WBC 6.9 11/19/2023   HGB 12.9 11/19/2023   HCT 38.0 11/19/2023   PLT 302 11/19/2023   GLUCOSE 66 (L) 11/19/2023   ALT 18 11/19/2023   AST 14  11/19/2023   NA 143 11/19/2023   K 4.6 11/19/2023   CL 107 (H) 11/19/2023   CREATININE 0.75 11/19/2023   BUN 18 11/19/2023   CO2 22 11/19/2023   TSH 1.020 11/19/2023   INR 0.9 09/23/2023    Results for orders placed or performed in visit on 11/19/23  B12 and Folate Panel   Collection Time: 11/19/23 10:52 AM  Result Value Ref Range   Vitamin B-12 629 232 - 1,245 pg/mL   Folate >20.0 >3.0 ng/mL  T4, free   Collection Time: 11/19/23 10:52 AM  Result Value Ref Range   Free T4 1.25 0.82 - 1.77 ng/dL  TSH   Collection Time: 11/19/23 10:52 AM  Result Value Ref Range   TSH 1.020 0.450 - 4.500 uIU/mL  CBC with Differential/Platelet   Collection Time: 11/19/23 10:55 AM  Result Value Ref Range   WBC 6.9 3.4 - 10.8 x10E3/uL   RBC 4.51 3.77 - 5.28 x10E6/uL   Hemoglobin 12.9 11.1 - 15.9 g/dL   Hematocrit 61.9 65.9 - 46.6 %   MCV 84 79 - 97 fL   MCH 28.6 26.6 - 33.0 pg   MCHC 33.9 31.5 - 35.7 g/dL   RDW 85.8 88.2 - 84.5 %   Platelets 302 150 - 450 x10E3/uL   Neutrophils 44 Not Estab. %   Lymphs 41 Not Estab. %   Monocytes 11 Not Estab. %   Eos 3  Not Estab. %   Basos 1 Not Estab. %   Neutrophils Absolute 3.0 1.4 - 7.0 x10E3/uL   Lymphocytes Absolute 2.8 0.7 - 3.1 x10E3/uL   Monocytes Absolute 0.8 0.1 - 0.9 x10E3/uL   EOS (ABSOLUTE) 0.2 0.0 - 0.4 x10E3/uL   Basophils Absolute 0.1 0.0 - 0.2 x10E3/uL   Immature Granulocytes 0 Not Estab. %   Immature Grans (Abs) 0.0 0.0 - 0.1 x10E3/uL  Comprehensive metabolic panel with GFR   Collection Time: 11/19/23 10:55 AM  Result Value Ref Range   Glucose 66 (L) 70 - 99 mg/dL   BUN 18 6 - 24 mg/dL   Creatinine, Ser 9.24 0.57 - 1.00 mg/dL   eGFR 96 >40 fO/fpw/8.26   BUN/Creatinine Ratio 24 (H) 9 - 23   Sodium 143 134 - 144 mmol/L   Potassium 4.6 3.5 - 5.2 mmol/L   Chloride 107 (H) 96 - 106 mmol/L   CO2 22 20 - 29 mmol/L   Calcium 9.1 8.7 - 10.2 mg/dL   Total Protein 6.8 6.0 - 8.5 g/dL   Albumin 4.1 3.8 - 4.9 g/dL   Globulin, Total 2.7  1.5 - 4.5 g/dL   Bilirubin Total 0.2 0.0 - 1.2 mg/dL   Alkaline Phosphatase 86 44 - 121 IU/L   AST 14 0 - 40 IU/L   ALT 18 0 - 32 IU/L  Sedimentation rate   Collection Time: 11/19/23 10:55 AM  Result Value Ref Range   Sed Rate 18 0 - 40 mm/hr  Magnesium   Collection Time: 11/19/23 10:55 AM  Result Value Ref Range   Magnesium 2.5 (H) 1.6 - 2.3 mg/dL  Rheumatoid factor   Collection Time: 11/19/23 10:55 AM  Result Value Ref Range   Rheumatoid fact SerPl-aCnc 10.2 <14.0 IU/mL  ANA w/Reflex   Collection Time: 11/19/23 10:55 AM  Result Value Ref Range   ANA Titer 1 Negative   .  Assessment & Plan:   Assessment & Plan Benign essential HTN Well controlled Blood pressure controlled with propranolol  and olmesartan . BP Readings from Last 3 Encounters:  04/04/24 122/76  02/11/24 106/64  02/01/24 138/88  - Continue propranolol  20 mg and olmesartan  40 mg once daily. Orders:   CBC with Differential/Platelet   CMP14+EGFR   Lipid panel  Gastroesophageal reflux disease without esophagitis Symptoms managed with pantoprazole . Omeprazole was less effective. - Continue pantoprazole  40 mg for GERD management.    Fibromyalgia Increased pain and brain fog, possibly due to weather changes. Localized arm and back pain affects arm movement. Managed with lidocaine  patches, Lyrica , and meloxicam . - Continue lidocaine  patches, Lyrica , and meloxicam . - Attend nerve conduction study today at 1:30 PM.    Overweight (BMI 25.0-29.9) BMI 28.64, not at goal - Continue to work on diet and exercise Orders:   Lipid panel  Encounter for immunization Received flu shot today. Orders:   Flu vaccine, recombinant, trivalent, inj   Follow-up: Return in about 3 months (around 07/05/2024) for chronic, fasting.  An After Visit Summary was printed and given to the patient.  Harrie Cedar, FNP [facility] [phone]

## 2024-04-04 NOTE — Assessment & Plan Note (Addendum)
 Increased pain and brain fog, possibly due to weather changes. Localized arm and back pain affects arm movement. Managed with lidocaine  patches, Lyrica , and meloxicam . - Continue lidocaine  patches, Lyrica , and meloxicam . - Attend nerve conduction study today at 1:30 PM.

## 2024-04-04 NOTE — Assessment & Plan Note (Addendum)
 Well controlled Blood pressure controlled with propranolol  and olmesartan . BP Readings from Last 3 Encounters:  04/04/24 122/76  02/11/24 106/64  02/01/24 138/88  - Continue propranolol  20 mg and olmesartan  40 mg once daily. Orders:   CBC with Differential/Platelet   CMP14+EGFR   Lipid panel

## 2024-04-04 NOTE — Procedures (Signed)
 [facility]  [address] [place], [address]  [place], [place] [address] Tel: [phone] Fax: [phone] Test Date:  04/04/2024  Patient: Alison Simpson DOB: 02-19-1971 Physician: Tonita Blanch, DO  Sex: Female Height: 5' 2 Ref Phys: Harrie Cedar, FNP  ID#: [id number]   Technician:    History: This is a 53 year old female referred for evaluation of left shoulder pain.  NCV & EMG Findings: Extensive electrodiagnostic testing of the left upper extremity shows:  Left median, ulnar, radial, medial antebrachial cutaneous, lateral antebrachial cutaneous, and mixed palmar sensory responses are within normal limits. Left median and ulnar motor responses are within normal limits. There is no evidence of active or chronic motor axonal loss changes affecting any of the tested muscles.  Motor unit configuration and recruitment pattern is within normal limits.  Impression: This is a normal study of the left upper extremity.  In particular, there is no evidence of a cervical radiculopathy, brachial plexopathy, carpal tunnel syndrome, or ulnar neuropathy.   ___________________________ Tonita Blanch, DO    Nerve Conduction Studies   Stim Site NR Peak (ms) Norm Peak (ms) O-P Amp (V) Norm O-P Amp  Left Lat Ante Brach Cutan Anti Sensory (Lat Forearm)  32 C  Lat Biceps    2.1 <2.9 13.4 >12  Left Med Ante Brach Cutan Anti Sensory (Med Forearm)  32 C  Elbow    2.1  16.0 >10  Left Median Anti Sensory (2nd Digit)  32 C  Wrist    2.6 <3.6 54.8 >15  Left Radial Anti Sensory (Base 1st Digit)  32 C  Wrist    1.9 <2.7 33.9 >14  Left Ulnar Anti Sensory (5th Digit)  32 C  Wrist    2.6 <3.1 43.3 >10     Stim Site NR Onset (ms) Norm Onset (ms) O-P Amp (mV) Norm O-P Amp Site1 Site2 Delta-0 (ms) Dist (cm) Vel (m/s) Norm Vel (m/s)  Left Median Motor (Abd Poll Brev)  32 C  Wrist    2.9 <4.0 8.1 >6 Elbow Wrist 4.8 28.0 58 >50  Elbow    7.7  7.8         Left Ulnar Motor (Abd Dig Minimi)  32 C   Wrist    2.5 <3.1 7.8 >7 B Elbow Wrist 3.2 21.0 66 >50  B Elbow    5.7  7.5  A Elbow B Elbow 1.5 10.0 67 >50  A Elbow    7.2  7.4            Stim Site NR Peak (ms) Norm Peak (ms) P-T Amp (V) Site1 Site2 Delta-P (ms) Norm Delta (ms)  Left Median/Ulnar Palm Comparison (Wrist - 8cm)  32 C  Median Palm    1.5 <2.2 108.2 Median Palm Ulnar Palm 0.1   Ulnar Palm    1.6 <2.2 19.0       Electromyography   Side Muscle Ins.Act Fibs Fasc Recrt Amp Dur Poly Activation Comment  Left 1stDorInt Nml Nml Nml Nml Nml Nml Nml Nml N/A  Left PronatorTeres Nml Nml Nml Nml Nml Nml Nml Nml N/A  Left Biceps Nml Nml Nml Nml Nml Nml Nml Nml N/A  Left Triceps Nml Nml Nml Nml Nml Nml Nml Nml N/A  Left Deltoid Nml Nml Nml Nml Nml Nml Nml Nml N/A  Left Infraspinatus Nml Nml Nml Nml Nml Nml Nml Nml N/A      Waveforms:

## 2024-04-04 NOTE — Assessment & Plan Note (Addendum)
 Symptoms managed with pantoprazole . Omeprazole was less effective. - Continue pantoprazole  40 mg for GERD management.

## 2024-04-04 NOTE — Assessment & Plan Note (Deleted)
 SABRA

## 2024-04-05 LAB — LIPID PANEL
Chol/HDL Ratio: 3.4 ratio (ref 0.0–4.4)
Cholesterol, Total: 225 mg/dL — ABNORMAL HIGH (ref 100–199)
HDL: 66 mg/dL (ref >39)
LDL Chol Calc (NIH): 138 mg/dL — ABNORMAL HIGH (ref 0–99)
Triglycerides: 117 mg/dL (ref 0–149)
VLDL Cholesterol Cal: 21 mg/dL (ref 5–40)

## 2024-04-05 LAB — CMP14+EGFR
ALT: 19 IU/L (ref 0–32)
AST: 18 IU/L (ref 0–40)
Albumin: 3.9 g/dL (ref 3.8–4.9)
Alkaline Phosphatase: 87 IU/L (ref 49–135)
BUN/Creatinine Ratio: 29 — ABNORMAL HIGH (ref 9–23)
BUN: 19 mg/dL (ref 6–24)
Bilirubin Total: 0.3 mg/dL (ref 0.0–1.2)
CO2: 24 mmol/L (ref 20–29)
Calcium: 8.9 mg/dL (ref 8.7–10.2)
Chloride: 105 mmol/L (ref 96–106)
Creatinine, Ser: 0.66 mg/dL (ref 0.57–1.00)
Globulin, Total: 2.9 g/dL (ref 1.5–4.5)
Glucose: 89 mg/dL (ref 70–99)
Potassium: 4.2 mmol/L (ref 3.5–5.2)
Sodium: 142 mmol/L (ref 134–144)
Total Protein: 6.8 g/dL (ref 6.0–8.5)
eGFR: 105 mL/min/1.73 (ref >59)

## 2024-04-05 LAB — CBC WITH DIFFERENTIAL/PLATELET
Basophils Absolute: 0.1 x10E3/uL (ref 0.0–0.2)
Basos: 1 %
EOS (ABSOLUTE): 0.2 x10E3/uL (ref 0.0–0.4)
Eos: 3 %
Hematocrit: 39 % (ref 34.0–46.6)
Hemoglobin: 13.1 g/dL (ref 11.1–15.9)
Immature Grans (Abs): 0 x10E3/uL (ref 0.0–0.1)
Immature Granulocytes: 0 %
Lymphocytes Absolute: 2.7 x10E3/uL (ref 0.7–3.1)
Lymphs: 47 %
MCH: 28.6 pg (ref 26.6–33.0)
MCHC: 33.6 g/dL (ref 31.5–35.7)
MCV: 85 fL (ref 79–97)
Monocytes Absolute: 0.5 x10E3/uL (ref 0.1–0.9)
Monocytes: 8 %
Neutrophils Absolute: 2.3 x10E3/uL (ref 1.4–7.0)
Neutrophils: 41 %
Platelets: 306 x10E3/uL (ref 150–450)
RBC: 4.58 x10E6/uL (ref 3.77–5.28)
RDW: 13.6 % (ref 11.7–15.4)
WBC: 5.7 x10E3/uL (ref 3.4–10.8)

## 2024-04-08 ENCOUNTER — Ambulatory Visit: Payer: Self-pay | Admitting: Family Medicine

## 2024-04-21 ENCOUNTER — Other Ambulatory Visit: Payer: Self-pay

## 2024-05-14 ENCOUNTER — Ambulatory Visit: Payer: Self-pay

## 2024-05-14 NOTE — Telephone Encounter (Signed)
 FYI Only or Action Required?: FYI only for provider: ED advised.  Patient was last seen in primary care on 04/04/2024 by Teressa Harrie HERO, FNP.  Called Nurse Triage reporting Heartburn and Chest Pain.  Symptoms began yesterday.  Interventions attempted: Nothing.  Symptoms are: unchanged.  Triage Disposition: Call EMS 911 Now  Patient/caregiver understands and will follow disposition?: Yes    Copied from CRM [id number]. Topic: Clinical - Red Word Triage >> May 14, 2024  3:15 PM Tiffini S wrote: [facility] that prompted transfer to Nurse Triage: Acid reflex with a cough that is very painful, painful sore throat and wheezing, tightness in chest - started last night Reason for Disposition  [1] Chest pain lasts > 5 minutes AND [2] age > 42  Answer Assessment - Initial Assessment Questions Pt states in the middle of the night she had a bout of acid reflux which she hasn't had in a month. It caused a coughing fit and then last night and today has had chest tightness which she describes as if you are running and breathing in cold air, also her throat hurts. She then described the chest pain as a dull ache as if someone punched her in the chest. She has had aspiration pneumonia and is worried it feels kind of like the start of that.  Said multiple times she just doesn't feel right. Also having fatigue.       1. LOCATION: Where does it hurt?       Across entire chest and under breasts 2. RADIATION: Does the pain go anywhere else? (e.g., into neck, jaw, arms, back)     no 3. ONSET: When did the chest pain begin? (Minutes, hours or days)      Last night in the middle of the night.  4. PATTERN: Does the pain come and go, or has it been constant since it started?  Does it get worse with exertion?      constant 5. DURATION: How long does it last (e.g., seconds, minutes, hours)     constant 6. SEVERITY: How bad is the pain?  (e.g., Scale 1-10; mild, moderate, or severe)     Dull  ache all day,  7. CARDIAC RISK FACTORS: Do you have any history of heart problems or risk factors for heart disease? (e.g., angina, prior heart attack; diabetes, high blood pressure, high cholesterol, smoker, or strong family history of heart disease)     htn 8. PULMONARY RISK FACTORS: Do you have any history of lung disease?  (e.g., blood clots in lung, asthma, emphysema, birth control pills)     asthma 9. CAUSE: What do you think is causing the chest pain?     Thought her reflux but this has never happened with her reflux before 10. OTHER SYMPTOMS: Do you have any other symptoms? (e.g., dizziness, nausea, vomiting, sweating, fever, difficulty breathing, cough)       Cough and she states slight wheezing.  Protocols used: Chest Pain-A-AH

## 2024-05-15 DIAGNOSIS — R0600 Dyspnea, unspecified: Secondary | ICD-10-CM | POA: Diagnosis not present

## 2024-05-15 DIAGNOSIS — R062 Wheezing: Secondary | ICD-10-CM | POA: Diagnosis not present

## 2024-05-15 DIAGNOSIS — R059 Cough, unspecified: Secondary | ICD-10-CM | POA: Diagnosis not present

## 2024-06-17 ENCOUNTER — Other Ambulatory Visit: Payer: Self-pay | Admitting: Family Medicine

## 2024-07-08 ENCOUNTER — Ambulatory Visit: Admitting: Family Medicine

## 2024-07-10 ENCOUNTER — Ambulatory Visit: Payer: Self-pay

## 2024-07-10 NOTE — Telephone Encounter (Addendum)
 FYI Only or Action Required?: FYI only for provider: ED advised.  Patient was last seen in primary care on 04/04/2024 by Teressa Harrie HERO, FNP.  Called Nurse Triage reporting Blurred Vision.  Symptoms began today.  Interventions attempted: Nothing.  Symptoms are: unchanged.  Triage Disposition: Go to ED Now (or PCP Triage)  Patient/caregiver understands and will follow disposition?: Yes   Copied from CRM [id number]. Topic: Clinical - Red Word Triage >> Jul 10, 2024 10:02 AM Gustabo D wrote: PT thinks she may have a ear infection. Has a nurse coming to check her bp due having blurred vision and feeling light headed Reason for Disposition  [1] Blurred vision or visual changes AND [2] present now AND [3] sudden onset or new (e.g., minutes, hours, days)  (Exception: Seeing floaters / black specks OR previously diagnosed migraine headaches with same symptoms.)  Answer Assessment - Initial Assessment Questions .  Answer Assessment - Initial Assessment Questions Advised ED now, patient reports will have husband to drive.  Advised 911 if symptoms worsen: Severe HA, changes vision, confusion, diff speech/ walking, numbness/weakness one side of body. Patient verbalized understanding.  1. SYMPTOM: What is the main symptom you are concerned about? (e.g., weakness, numbness)     Blurred vision, dizziness ongoing denies weakness/ numbness, unsteady gait, confusion, diff speech, diff breathing,chest pain, sweating 2. ONSET: When did this start? (e.g., minutes, hours, days; while sleeping)     30 minutes ago 3. LAST NORMAL: When was the last time you (the patient) were normal (no symptoms)?     today 4. PATTERN Does this come and go, or has it been constant since it started?  Is it present now?     no 5. CARDIAC SYMPTOMS: Have you had any of the following symptoms: chest pain, difficulty breathing, palpitations?     no 6. NEUROLOGIC SYMPTOMS: Have you had any of the following  symptoms: headache, dizziness, vision loss, double vision, changes in speech, unsteady on your feet?     denies 7. OTHER SYMPTOMS: Do you have any other symptoms? Last night stabbing ear pain, HA; not currently  Protocols used: Neurologic Deficit-A-AH, Vision Loss or Change-A-AH 142/78 hr 80, 98%RA

## 2024-07-23 ENCOUNTER — Other Ambulatory Visit: Payer: Self-pay

## 2024-07-23 ENCOUNTER — Other Ambulatory Visit: Payer: Self-pay | Admitting: Family Medicine

## 2024-07-23 DIAGNOSIS — R0602 Shortness of breath: Secondary | ICD-10-CM

## 2024-07-23 MED ORDER — MONTELUKAST SODIUM 10 MG PO TABS: 10.0000 mg | ORAL_TABLET | Freq: Every day | ORAL | 0 refills | Status: AC
